# Patient Record
Sex: Female | Born: 1955 | Race: White | Hispanic: No | Smoking: Current every day smoker
Health system: Southern US, Community
[De-identification: ages and names within clinical notes are randomized; demographics above are authoritative.]

## PROBLEM LIST (undated history)

## (undated) DIAGNOSIS — E079 Disorder of thyroid, unspecified: Secondary | ICD-10-CM

## (undated) HISTORY — DX: Disorder of thyroid, unspecified: E07.9

---

## 2005-02-22 ENCOUNTER — Ambulatory Visit: Payer: Self-pay | Admitting: Internal Medicine

## 2005-02-25 ENCOUNTER — Ambulatory Visit: Payer: Self-pay | Admitting: Internal Medicine

## 2005-08-31 ENCOUNTER — Ambulatory Visit: Payer: Self-pay | Admitting: Internal Medicine

## 2006-06-16 ENCOUNTER — Ambulatory Visit: Payer: Self-pay | Admitting: Internal Medicine

## 2006-09-01 ENCOUNTER — Ambulatory Visit: Payer: Self-pay | Admitting: Internal Medicine

## 2007-11-08 ENCOUNTER — Ambulatory Visit: Payer: Self-pay | Admitting: Internal Medicine

## 2008-11-10 ENCOUNTER — Ambulatory Visit: Payer: Self-pay | Admitting: Internal Medicine

## 2009-11-11 ENCOUNTER — Ambulatory Visit: Payer: Self-pay | Admitting: Internal Medicine

## 2010-11-16 ENCOUNTER — Ambulatory Visit: Payer: Self-pay | Admitting: Internal Medicine

## 2011-12-08 ENCOUNTER — Ambulatory Visit: Payer: Self-pay | Admitting: Internal Medicine

## 2012-12-25 ENCOUNTER — Ambulatory Visit: Payer: Self-pay | Admitting: Internal Medicine

## 2014-01-13 ENCOUNTER — Ambulatory Visit: Payer: Self-pay | Admitting: Internal Medicine

## 2014-12-17 ENCOUNTER — Other Ambulatory Visit: Payer: Self-pay | Admitting: Internal Medicine

## 2014-12-17 DIAGNOSIS — Z1231 Encounter for screening mammogram for malignant neoplasm of breast: Secondary | ICD-10-CM

## 2015-01-15 ENCOUNTER — Ambulatory Visit: Payer: Self-pay

## 2015-01-15 ENCOUNTER — Ambulatory Visit
Admission: RE | Admit: 2015-01-15 | Discharge: 2015-01-15 | Disposition: A | Discharge status: Home / Self Care | Payer: No Typology Code available for payment source | Source: Ambulatory Visit | Attending: Internal Medicine | Admitting: Internal Medicine

## 2015-01-15 DIAGNOSIS — Z1231 Encounter for screening mammogram for malignant neoplasm of breast: Secondary | ICD-10-CM | POA: Insufficient documentation

## 2016-07-11 ENCOUNTER — Ambulatory Visit: Payer: No Typology Code available for payment source | Admitting: Oncology

## 2016-07-13 ENCOUNTER — Inpatient Hospital Stay: Payer: BLUE CROSS/BLUE SHIELD | Attending: Hematology and Oncology | Admitting: Hematology and Oncology

## 2016-07-13 ENCOUNTER — Telehealth: Payer: Self-pay | Admitting: *Deleted

## 2016-07-13 ENCOUNTER — Encounter: Payer: Self-pay | Admitting: Hematology and Oncology

## 2016-07-13 VITALS — BP 112/72 | HR 98 | Temp 96.9°F | Resp 18 | Ht 66.5 in | Wt 133.6 lb

## 2016-07-13 DIAGNOSIS — D649 Anemia, unspecified: Secondary | ICD-10-CM | POA: Diagnosis not present

## 2016-07-13 DIAGNOSIS — F17210 Nicotine dependence, cigarettes, uncomplicated: Secondary | ICD-10-CM | POA: Diagnosis not present

## 2016-07-13 DIAGNOSIS — E079 Disorder of thyroid, unspecified: Secondary | ICD-10-CM | POA: Insufficient documentation

## 2016-07-13 DIAGNOSIS — D72820 Lymphocytosis (symptomatic): Secondary | ICD-10-CM | POA: Diagnosis not present

## 2016-07-13 DIAGNOSIS — D472 Monoclonal gammopathy: Secondary | ICD-10-CM | POA: Insufficient documentation

## 2016-07-13 DIAGNOSIS — M5430 Sciatica, unspecified side: Secondary | ICD-10-CM | POA: Insufficient documentation

## 2016-07-13 DIAGNOSIS — R7989 Other specified abnormal findings of blood chemistry: Secondary | ICD-10-CM | POA: Diagnosis not present

## 2016-07-13 DIAGNOSIS — Z79899 Other long term (current) drug therapy: Secondary | ICD-10-CM | POA: Diagnosis not present

## 2016-07-13 DIAGNOSIS — C9110 Chronic lymphocytic leukemia of B-cell type not having achieved remission: Secondary | ICD-10-CM | POA: Insufficient documentation

## 2016-07-13 NOTE — Telephone Encounter (Signed)
Received referral for low dose lung cancer screening CT scan. Voicemail left at phone number listed in EMR for patient to call me back to facilitate scheduling scan.  

## 2016-07-13 NOTE — Progress Notes (Signed)
Patient here today as new evaluation regarding lymphocytosis.  Referred by Dr. Ramonita Lab.  Patient offers no complaints today.

## 2016-07-13 NOTE — Progress Notes (Signed)
[facility] day:  07/13/2016  Chief Complaint: Melissa Castro is a 61 y.o. female with lymphocytosis, monoclonal gammopathy, and abnormal flow cytometry who is referred in consultation by Dr. Ramonita Lab for assessment and management.  HPI:  The patient has been followed by Dr. Caryl Comes for 10-15 years.  She states that blood work is done every 6 months to follow her cholesterol and thyroid function.  She stats that she has had anemia "all my life".  She does not eat much.  She comments that she previously donated blood, but quit 5 years ago.  She describes her diet as a bagel or pop tart in the morning.  She eats peanut butter and bananas or a toasted bagel and peanut butter.  Two to three times a week, she will eat cereal.  Weight is stable.  She denies pica.  She has never had a colonoscopy.  The patient has had lymphocytosis dating back to 12/03/2013.  Absolute lymphocyte count has increased over time from 4410 to 7860.  Peripheral smear on 06/15/2015 revealed 33% lymphocytes and 24% variant lymphocytes.  Flow cytometry for [facility] on 12/10/2014 was negative.  CBC on 06/15/2016 revealed a hematocrit of 41.4, hemoglobin 14.1, MCV 95.4, platelet count 283,000, WBC 12,900.  Differential included 28% neutrophils, 61% lymphocytes, 7% monocytes and 1% basophils.  Absolute lymphocyte count was 7860.  CMP revealed a creatinine 0.8, calcium 9.4, protein 6.3, and albumen 4.3.  SPEP on 06/15/2016 revealed a 0.1 gm/dL monoclonal spike.  IgG was 618 (low).  IgA was 183 and IgM was 31.  Flow cytometry on 06/22/2016 revealed a CD5+, CD23+ monoclonal B cell population with kappa light chain restriction consistent with a CLL/SLL phenotype.  The population represented 97% of the B-cells and 28% of the leukocytes.  There was notation that small populations (< 5,000) of clonal B -cell are classified as monoclonal B-cell lymphocytosis.  CD38 is not expressed on the clonal B-cells.   There was no loss of, or aberrant expression of, the pan T cell antigens to suggest a neoplastic T cell process.  CD4:CD8 was ratio 2.9.  There were no circulating blasts.  There was no immunophenotypicevidence of abnormal myeloid maturation.  There was no monoclonal or abnormal plasma cell population is detected.  Symptomatically, she denies any B symptoms.  She denies any issues with infections.  She denies any adenopathy, bruising or bleeding.  She has had sciatica problems for years.     Past Medical History:  Diagnosis Date  . Thyroid disease     History reviewed. No pertinent surgical history.  History reviewed. No pertinent family history.  Social History:  reports that she has been smoking.  She has a 60.00 pack-year smoking history. She does not have any smokeless tobacco history on file. Her alcohol and drug histories are not on file.  She has smoked 1 1/2 packs/day x 40 years.  She denies any exposure to radiation or toxins.  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  Her husband had a liver transplant.  She lives in [place].  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  The patient is alone today.  Allergies: Not on File  Current Medications: Current Outpatient Prescriptions  Medication Sig Dispense Refill  . Calcium Carbonate-Vitamin D (CALCIUM-VITAMIN D) 600-125 MG-UNIT TABS Take by mouth daily.    . Cholecalciferol (VITAMIN D3) 5000 units TABS Take by mouth.    . Ibuprofen-Diphenhydramine Cit (IBUPROFEN PM PO) Take by mouth at bedtime  as needed.    Marland Kitchen levothyroxine (SYNTHROID, LEVOTHROID) 125 MCG tablet Take by mouth.    . simvastatin (ZOCOR) 20 MG tablet Take by mouth.     No current facility-administered medications for this visit.     Review of Systems:  GENERAL:  Feels fine.  Active.  No fevers, sweats or weight loss. PERFORMANCE STATUS (ECOG): 0 HEENT:  No visual changes, runny nose, sore throat, mouth sores or tenderness. Lungs: No shortness of breath or cough.  No hemoptysis. Cardiac:   No chest pain, palpitations, orthopnea, or PND. Breasts:  No concerns.  No recent mammogram. GI:  No nausea, vomiting, diarrhea, constipation, melena or hematochezia.  No prior colonoscopy. GU:  No urgency, frequency, dysuria, or hematuria. Musculoskeletal:  No back pain.  No joint pain.  No muscle tenderness. Extremities:  No pain or swelling. Skin:  No rashes or skin changes. Neuro:  Sciatica problem for years.  No headache, numbness or weakness, balance or coordination issues. Endocrine:  No diabetes.  Thyroid disease on Synthroid.  No hot flashes or night sweats. Psych:  No mood changes, depression or anxiety. Pain:  No focal pain. Review of systems:  All other systems reviewed and found to be negative.  Physical Exam: Blood pressure 112/72, pulse 98, temperature (!) 96.9 F (36.1 C), temperature source Tympanic, resp. rate 18, weight 133 lb 9.6 oz (60.6 kg). GENERAL:  Well developed, well nourished, woman sitting comfortably in the exam room in no acute distress. MENTAL STATUS:  Alert and oriented to person, place and time. HEAD:  Short dark graying hair.  Normocephalic, atraumatic, face symmetric, no Cushingoid features. EYES:  Brown eyes.  Pupils equal round and reactive to light and accomodation.  No conjunctivitis or scleral icterus. ENT:  Oropharynx clear without lesion.  Tongue normal. Mucous membranes moist.  RESPIRATORY:  Clear to auscultation without rales, wheezes or rhonchi. CARDIOVASCULAR:  Regular rate and rhythm without murmur, rub or gallop. ABDOMEN:  Soft, non-tender, with active bowel sounds, and no hepatosplenomegaly.  No masses. SKIN:  No rashes, ulcers or lesions. EXTREMITIES: No edema, no skin discoloration or tenderness.  No palpable cords. LYMPH NODES: No palpable cervical, supraclavicular, axillary or inguinal adenopathy  NEUROLOGICAL: Unremarkable. PSYCH:  Appropriate.   No visits with results within 3 Day(s) from this visit.  Latest known visit with  results is:  No results found for any previous visit.    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  She has had lymphocytosis dating back to 12/03/2013.  Absolute lymphocyte count has increased over time from 4410 to 7860.  Peripheral smear on 06/15/2015 revealed 33% lymphocytes and 24% variant lymphocytes.    CBC on 06/15/2016 revealed a hematocrit of 41.4, hemoglobin 14.1, MCV 95.4, platelet count 283,000, WBC 12,900.  Differential included 28% neutrophils, 61% lymphocytes, 7% monocytes and 1% basophils.  Absolute lymphocyte count was 7860.  CMP revealed a creatinine 0.8, calcium 9.4, protein 6.3, and albumen 4.3.  SPEP on 06/15/2016 revealed a 0.1 gm/dL monoclonal spike.  IgG was 618 (low).  IgA was 183 and IgM was 31.  Flow cytometry on 06/22/2016 revealed a CD5+, CD23+ monoclonal B cell population with kappa light chain restriction consistent with a CLL/SLL phenotype.  The population represented 97% of the B-cells and 28% of the leukocytes (3612).  There was notation that small populations (< 5,000) of clonal B -cell are classified as monoclonal B-cell lymphocytosis.  CD38 was  not expressed on the clonal B-cells.  There was no loss of, or aberrant expression of, the pan T cell antigens to suggest a neoplastic T cell process.  CD4:CD8 was ratio 2.9.  There were no circulating blasts.  There was no immunophenotypicevidence of abnormal myeloid maturation.  There was no monoclonal or abnormal plasma cell population is detected.  Symptomatically, she denies any B symptoms.  She denies any issues with infections.  She denies any adenopathy, bruising or bleeding.  She has had sciatica problems for years.    Plan: 1.  Discuss recent work-up.  Based on recent flow cytometry suspect she has monoclonal B cell lymphocytosis.  It is also possible that she has stage 0 CLL.  Risk of transformation to CLL is 1%/year.  CBC is otherwise normal.   No B symptoms.  No palpable adenopathy or hepatosplenomegaly.  No issues with infections.  Discuss plan for monitoring every 6 months. 2.  Discuss need for mammogram (late). 3.  Discuss colonoscopy (no prior per patient). 4.  Discuss smoking cessation. 5.  Discuss referral to [facility] for low dose chest CT program. 6.  RTC in 5 months for MD assessment and labs (CBC with diff, CMP, LDH, SPEP, immunofixation, free light chains, beta 2-microglobulin).   Lequita Asal, MD  07/13/2016, 10:44 AM

## 2016-07-19 ENCOUNTER — Other Ambulatory Visit: Payer: Self-pay | Admitting: Internal Medicine

## 2016-07-19 DIAGNOSIS — Z1231 Encounter for screening mammogram for malignant neoplasm of breast: Secondary | ICD-10-CM

## 2016-08-11 ENCOUNTER — Telehealth: Payer: Self-pay | Admitting: *Deleted

## 2016-08-11 NOTE — Telephone Encounter (Signed)
Received referral for low dose lung cancer screening CT scan. Voicemail left at phone number listed in EMR for patient to call me back to facilitate scheduling scan.  

## 2016-08-16 ENCOUNTER — Ambulatory Visit
Admission: RE | Admit: 2016-08-16 | Discharge: 2016-08-16 | Disposition: A | Discharge status: Home / Self Care | Payer: BLUE CROSS/BLUE SHIELD | Source: Ambulatory Visit | Attending: Internal Medicine | Admitting: Internal Medicine

## 2016-08-16 DIAGNOSIS — Z1231 Encounter for screening mammogram for malignant neoplasm of breast: Secondary | ICD-10-CM | POA: Insufficient documentation

## 2016-09-04 ENCOUNTER — Encounter: Payer: Self-pay | Admitting: Hematology and Oncology

## 2016-09-04 DIAGNOSIS — D472 Monoclonal gammopathy: Secondary | ICD-10-CM | POA: Insufficient documentation

## 2016-09-05 ENCOUNTER — Encounter: Payer: Self-pay | Admitting: *Deleted

## 2016-12-12 ENCOUNTER — Ambulatory Visit: Payer: BLUE CROSS/BLUE SHIELD | Admitting: Hematology and Oncology

## 2016-12-12 ENCOUNTER — Other Ambulatory Visit: Payer: BLUE CROSS/BLUE SHIELD

## 2017-01-16 ENCOUNTER — Other Ambulatory Visit: Payer: BLUE CROSS/BLUE SHIELD

## 2017-01-16 ENCOUNTER — Ambulatory Visit: Payer: BLUE CROSS/BLUE SHIELD | Admitting: Hematology and Oncology

## 2017-01-19 ENCOUNTER — Inpatient Hospital Stay: Payer: BLUE CROSS/BLUE SHIELD | Attending: Hematology and Oncology | Admitting: Hematology and Oncology

## 2017-01-19 ENCOUNTER — Encounter: Payer: Self-pay | Admitting: Hematology and Oncology

## 2017-01-19 ENCOUNTER — Inpatient Hospital Stay: Payer: BLUE CROSS/BLUE SHIELD

## 2017-01-19 ENCOUNTER — Telehealth: Payer: Self-pay | Admitting: *Deleted

## 2017-01-19 VITALS — BP 122/71 | HR 76 | Temp 96.5°F | Resp 18 | Wt 137.2 lb

## 2017-01-19 DIAGNOSIS — R079 Chest pain, unspecified: Secondary | ICD-10-CM | POA: Insufficient documentation

## 2017-01-19 DIAGNOSIS — D72820 Lymphocytosis (symptomatic): Secondary | ICD-10-CM | POA: Diagnosis not present

## 2017-01-19 DIAGNOSIS — Z87891 Personal history of nicotine dependence: Secondary | ICD-10-CM

## 2017-01-19 DIAGNOSIS — Z79899 Other long term (current) drug therapy: Secondary | ICD-10-CM | POA: Diagnosis not present

## 2017-01-19 DIAGNOSIS — F17210 Nicotine dependence, cigarettes, uncomplicated: Secondary | ICD-10-CM | POA: Insufficient documentation

## 2017-01-19 DIAGNOSIS — D472 Monoclonal gammopathy: Secondary | ICD-10-CM

## 2017-01-19 DIAGNOSIS — K047 Periapical abscess without sinus: Secondary | ICD-10-CM | POA: Insufficient documentation

## 2017-01-19 LAB — CBC WITH DIFFERENTIAL/PLATELET
Basophils Absolute: 0.1 10*3/uL (ref 0–0.1)
Basophils Relative: 1 %
Eosinophils Absolute: 0.6 10*3/uL (ref 0–0.7)
Eosinophils Relative: 6 %
HCT: 39 % (ref 35.0–47.0)
Hemoglobin: 13.5 g/dL (ref 12.0–16.0)
Lymphocytes Relative: 52 %
Lymphs Abs: 5.8 10*3/uL — ABNORMAL HIGH (ref 1.0–3.6)
MCH: 32.2 pg (ref 26.0–34.0)
MCHC: 34.6 g/dL (ref 32.0–36.0)
MCV: 93 fL (ref 80.0–100.0)
Monocytes Absolute: 0.6 10*3/uL (ref 0.2–0.9)
Monocytes Relative: 6 %
Neutro Abs: 3.9 10*3/uL (ref 1.4–6.5)
Neutrophils Relative %: 35 %
Platelets: 265 10*3/uL (ref 150–440)
RBC: 4.2 MIL/uL (ref 3.80–5.20)
RDW: 13.7 % (ref 11.5–14.5)
WBC: 11.1 10*3/uL — ABNORMAL HIGH (ref 3.6–11.0)

## 2017-01-19 LAB — LACTATE DEHYDROGENASE: LDH: 161 U/L (ref 98–192)

## 2017-01-19 LAB — COMPREHENSIVE METABOLIC PANEL
ALT: 18 U/L (ref 14–54)
AST: 27 U/L (ref 15–41)
Albumin: 4.5 g/dL (ref 3.5–5.0)
Alkaline Phosphatase: 51 U/L (ref 38–126)
Anion gap: 6 (ref 5–15)
BUN: 11 mg/dL (ref 6–20)
CO2: 28 mmol/L (ref 22–32)
Calcium: 9.5 mg/dL (ref 8.9–10.3)
Chloride: 103 mmol/L (ref 101–111)
Creatinine, Ser: 0.81 mg/dL (ref 0.44–1.00)
GFR calc Af Amer: >60 mL/min (ref >60)
GFR calc non Af Amer: >60 mL/min (ref >60)
Glucose, Bld: 130 mg/dL — ABNORMAL HIGH (ref 65–99)
Potassium: 4.3 mmol/L (ref 3.5–5.1)
Sodium: 137 mmol/L (ref 135–145)
Total Bilirubin: 0.5 mg/dL (ref 0.3–1.2)
Total Protein: 7 g/dL (ref 6.5–8.1)

## 2017-01-19 NOTE — Progress Notes (Signed)
[facility] day:  01/19/2017  Chief Complaint: Melissa Castro is a 61 y.o. 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  HPI:  The patient was last seen in the medical oncology clinic on 07/13/2016.  At that time, she was seen for initial consultation.  She had a small populations (< 5,000) of clonal B -cells.  She denied any B symptoms.  She denied any issues with infections.  She denied any adenopathy, bruising or bleeding. She was felt to have a monoclonal-B cell lymphocytosis or stage 0 CLL.  We discussed plan for surveillance every 6 months.  At last visit, we discussed mammogram (late), colonoscopy (no prior per patient), smoking cessation, and referral to the low dose chest CT program.  Bilateral mammogram on 08/16/2016 revealed no evidence of malignancy.  Symptomatically, she states "I'm fine". She denies any B symptoms. She has had a dental infection secondary to receding gums. She declines referral to gastroenterology. She is smoking less or equal to 1 pack a day. She has been playing phone tag with [facility] Est[facility], RN regarding low dose chest CT scan program.   Past Medical History:  Diagnosis Date  . Thyroid disease     History reviewed. No pertinent surgical history.  Family History  Problem Relation Age of Onset  . Breast cancer Neg Hx     Social History:  reports that she has been smoking.  She has a 60.00 pack-year smoking history. She has never used smokeless tobacco. Her alcohol and drug histories are not on file.  She has smoked 1 1/2 packs/day x 40 years.  She denies any exposure to radiation or toxins.  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  Her husband had a liver transplant.  She lives in [place].  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  The patient is alone today.  Allergies: Not on File  Current Medications: Current Outpatient Prescriptions  Medication Sig Dispense Refill  . Calcium Carbonate-Vitamin  D (CALCIUM-VITAMIN D) 600-125 MG-UNIT TABS Take by mouth daily.    . Cholecalciferol (VITAMIN D3) 5000 units TABS Take by mouth.    . Ibuprofen-Diphenhydramine Cit (IBUPROFEN PM PO) Take by mouth at bedtime as needed.    Marland Kitchen levothyroxine (SYNTHROID, LEVOTHROID) 125 MCG tablet Take by mouth.    . simvastatin (ZOCOR) 20 MG tablet Take by mouth.    Marland Kitchen amoxicillin (AMOXIL) 875 MG tablet Take 875 mg by mouth 3 (three) times daily.  0   No current facility-administered medications for this visit.     Review of Systems:  GENERAL:  Feels fine.  Active.  No fevers, sweats or weight loss.  Weight up 4 pounds. PERFORMANCE STATUS (ECOG): 0 HEENT:  No visual changes, runny nose, sore throat, mouth sores or tenderness. Lungs: No shortness of breath or cough.  No hemoptysis. Cardiac:  No chest pain, palpitations, orthopnea, or PND. Breasts:  No concerns.  Recent mammogram (see HPI). GI:  No nausea, vomiting, diarrhea, constipation, melena or hematochezia.  No prior colonoscopy. GU:  No urgency, frequency, dysuria, or hematuria. Musculoskeletal:  No back pain.  No joint pain.  No muscle tenderness. Extremities:  No pain or swelling. Skin:  No rashes or skin changes. Neuro:  Sciatica problem for years.  No headache, numbness or weakness, balance or coordination issues. Endocrine:  No diabetes.  Thyroid disease on Synthroid.  No hot flashes or night sweats. Psych:  No mood changes, depression or anxiety. Pain:  No focal pain. Review of systems:  All other systems reviewed and found to be negative.  Physical Exam: Blood pressure 122/71, pulse 76, temperature (!) 96.5 F (35.8 C), temperature source Tympanic, resp. rate 18, weight 137 lb 4 oz (62.3 kg). GENERAL:  Well developed, well nourished, woman sitting comfortably in the exam room in no acute distress. MENTAL STATUS:  Alert and oriented to person, place and time. HEAD:  Short dark graying hair.  Normocephalic, atraumatic, face symmetric, no  Cushingoid features. EYES:  Brown eyes.  Pupils equal round and reactive to light and accomodation.  No conjunctivitis or scleral icterus. ENT:  Oropharynx clear without lesion.  Tongue normal. Mucous membranes moist.  RESPIRATORY:  Clear to auscultation without rales, wheezes or rhonchi. CARDIOVASCULAR:  Regular rate and rhythm without murmur, rub or gallop. ABDOMEN:  Soft, non-tender, with active bowel sounds, and no hepatosplenomegaly.  No masses. SKIN:  No rashes, ulcers or lesions. EXTREMITIES: No edema, no skin discoloration or tenderness.  No palpable cords. LYMPH NODES: No palpable cervical, supraclavicular, axillary or inguinal adenopathy  NEUROLOGICAL: Unremarkable. PSYCH:  Appropriate.   Appointment on 01/19/2017  Component Date Value Ref Range Status  . WBC 01/19/2017 11.1* 3.6 - 11.0 K/uL Final  . RBC 01/19/2017 4.20  3.80 - 5.20 MIL/uL Final  . Hemoglobin 01/19/2017 13.5  12.0 - 16.0 g/dL Final  . HCT 01/19/2017 39.0  35.0 - 47.0 % Final  . MCV 01/19/2017 93.0  80.0 - 100.0 fL Final  . MCH 01/19/2017 32.2  26.0 - 34.0 pg Final  . MCHC 01/19/2017 34.6  32.0 - 36.0 g/dL Final  . RDW 01/19/2017 13.7  11.5 - 14.5 % Final  . Platelets 01/19/2017 265  150 - 440 K/uL Final  . Neutrophils Relative % 01/19/2017 35  % Final  . Neutro Abs 01/19/2017 3.9  1.4 - 6.5 K/uL Final  . Lymphocytes Relative 01/19/2017 52  % Final  . Lymphs Abs 01/19/2017 5.8* 1.0 - 3.6 K/uL Final  . Monocytes Relative 01/19/2017 6  % Final  . Monocytes Absolute 01/19/2017 0.6  0.2 - 0.9 K/uL Final  . Eosinophils Relative 01/19/2017 6  % Final  . Eosinophils Absolute 01/19/2017 0.6  0 - 0.7 K/uL Final  . Basophils Relative 01/19/2017 1  % Final  . Basophils Absolute 01/19/2017 0.1  0 - 0.1 K/uL Final  . LDH 01/19/2017 161  98 - 192 U/L Final  . Sodium 01/19/2017 137  135 - 145 mmol/L Final  . Potassium 01/19/2017 4.3  3.5 - 5.1 mmol/L Final  . Chloride 01/19/2017 103  101 - 111 mmol/L Final  . CO2  01/19/2017 28  22 - 32 mmol/L Final  . Glucose, Bld 01/19/2017 130* 65 - 99 mg/dL Final  . BUN 01/19/2017 11  6 - 20 mg/dL Final  . Creatinine, Ser 01/19/2017 0.81  0.44 - 1.00 mg/dL Final  . Calcium 01/19/2017 9.5  8.9 - 10.3 mg/dL Final  . Total Protein 01/19/2017 7.0  6.5 - 8.1 g/dL Final  . Albumin 01/19/2017 4.5  3.5 - 5.0 g/dL Final  . AST 01/19/2017 27  15 - 41 U/L Final  . ALT 01/19/2017 18  14 - 54 U/L Final  . Alkaline Phosphatase 01/19/2017 51  38 - 126 U/L Final  . Total Bilirubin 01/19/2017 0.5  0.3 - 1.2 mg/dL Final  . GFR calc non Af Amer 01/19/2017 >60  >60 mL/min Final  . GFR calc Af Amer 01/19/2017 >60  >60 mL/min Final  Comment: (NOTE) The eGFR has been calculated using the CKD EPI equation. This calculation has not been validated in all clinical situations. eGFR's persistently <60 mL/min signify possible Chronic Kidney Disease.   Melissa Castro gap 01/19/2017 6  5 - 15 Final    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  She has had lymphocytosis dating back to 12/03/2013.  Absolute lymphocyte count has increased over time from 4410 to 7860.  Peripheral smear on 06/15/2015 revealed 33% lymphocytes and 24% variant lymphocytes.    CBC on 06/15/2016 revealed a hematocrit of 41.4, hemoglobin 14.1, MCV 95.4, platelet count 283,000, WBC 12,900.  Differential included 28% neutrophils, 61% lymphocytes, 7% monocytes and 1% basophils.  Absolute lymphocyte count was 7860.  CMP revealed a creatinine 0.8, calcium 9.4, protein 6.3, and albumen 4.3.  SPEP on 06/15/2016 revealed a 0.1 gm/dL monoclonal spike.  IgG was 618 (low).  IgA was 183 and IgM was 31.  Flow cytometry on 06/22/2016 revealed a CD5+, CD23+ monoclonal B cell population with kappa light chain restriction consistent with a CLL/SLL phenotype.  The population represented 97% of the B-cells and 28% of the leukocytes (3612).  There was notation that small  populations (< 5,000) of clonal B -cell are classified as monoclonal B-cell lymphocytosis.  CD38 was not expressed on the clonal B-cells.  There was no loss of, or aberrant expression of, the pan T cell antigens to suggest a neoplastic T cell process.  CD4:CD8 was ratio 2.9.  There were no circulating blasts.  There was no immunophenotypicevidence of abnormal myeloid maturation.  There was no monoclonal or abnormal plasma cell population is detected.  Bilateral mammogram on 08/16/2016 revealed no evidence of malignancy.  Symptomatically, she denies any B symptoms.  She has a dental infection.  She denies any adenopathy, bruising or bleeding.  Exam is normal.  Plan: 1.  Labs today:  CBC with diff, CMP, LDH, SPEP, immunofixation, free light chains, beta 2-microglobulin. 2.  Discuss interval mammogram.  No evidence of malignancy. 3.  Encourage follow-up colonoscopy.  Patient wishes to defer. 4.  Discuss enrollment in the low-dose chest CT program. Melissa Crutch, RN will be contacted today as they have been playing phone tag. 5.  Encourage smoking cessation. 6.  Discuss stability in counts.  Continue observation of monoclonal-B cell lymphocytosis or early stage 0 CLL. Risk of transformation to CLL is 1%/year.  No B symptoms.  No palpable adenopathy or hepatosplenomegaly.  No issues with infections except for recent dental infection.  Discuss plan for ongoing monitoring every 6 months. 7.  Patient to be seen today by [facility] for low dose chest CT program. 8.  RTC in 6 months for MD assessment and labs (CBC with diff, CMP, LDH).   Lequita Asal, MD  01/19/2017, 2:46 PM

## 2017-01-19 NOTE — Telephone Encounter (Signed)
Received referral for initial lung cancer screening scan. Contacted patient and obtained smoking history,(current, 64.5 pack year) as well as answering questions related to screening process. Patient denies signs of lung cancer such as weight loss or hemoptysis. Patient denies comorbidity that would prevent curative treatment if lung cancer were found. Patient is scheduled for shared decision making visit and CT scan on 02/07/17.

## 2017-01-19 NOTE — Progress Notes (Signed)
Patient is currently on antibiotics for am infected tooth.  Patient offers no other complaints today.

## 2017-01-20 LAB — KAPPA/LAMBDA LIGHT CHAINS
Kappa free light chain: 16.2 mg/L (ref 3.3–19.4)
Kappa, lambda light chain ratio: 1.05 (ref 0.26–1.65)
Lambda free light chains: 15.5 mg/L (ref 5.7–26.3)

## 2017-01-20 LAB — BETA 2 MICROGLOBULIN, SERUM: Beta-2 Microglobulin: 1.6 mg/L (ref 0.6–2.4)

## 2017-01-23 LAB — MULTIPLE MYELOMA PANEL, SERUM
Albumin SerPl Elph-Mcnc: 4 g/dL (ref 2.9–4.4)
Albumin/Glob SerPl: 1.7 (ref 0.7–1.7)
Alpha 1: 0.2 g/dL (ref 0.0–0.4)
Alpha2 Glob SerPl Elph-Mcnc: 0.6 g/dL (ref 0.4–1.0)
B-Globulin SerPl Elph-Mcnc: 1 g/dL (ref 0.7–1.3)
Gamma Glob SerPl Elph-Mcnc: 0.6 g/dL (ref 0.4–1.8)
Globulin, Total: 2.4 g/dL (ref 2.2–3.9)
IgA: 194 mg/dL (ref 87–352)
IgG (Immunoglobin G), Serum: 550 mg/dL — ABNORMAL LOW (ref 700–1600)
IgM, Serum: 29 mg/dL (ref 26–217)
M Protein SerPl Elph-Mcnc: 0.1 g/dL — ABNORMAL HIGH
Total Protein ELP: 6.4 g/dL (ref 6.0–8.5)

## 2017-02-07 ENCOUNTER — Encounter: Payer: Self-pay | Admitting: Oncology

## 2017-02-07 ENCOUNTER — Ambulatory Visit
Admission: RE | Admit: 2017-02-07 | Discharge: 2017-02-07 | Disposition: A | Discharge status: Home / Self Care | Payer: BLUE CROSS/BLUE SHIELD | Source: Ambulatory Visit | Attending: Oncology | Admitting: Oncology

## 2017-02-07 ENCOUNTER — Inpatient Hospital Stay: Payer: BLUE CROSS/BLUE SHIELD | Attending: Oncology | Admitting: Oncology

## 2017-02-07 DIAGNOSIS — Z122 Encounter for screening for malignant neoplasm of respiratory organs: Secondary | ICD-10-CM

## 2017-02-07 DIAGNOSIS — Z87891 Personal history of nicotine dependence: Secondary | ICD-10-CM

## 2017-02-07 DIAGNOSIS — F17210 Nicotine dependence, cigarettes, uncomplicated: Secondary | ICD-10-CM

## 2017-02-07 NOTE — Progress Notes (Signed)
In accordance with CMS guidelines, patient has met eligibility criteria including age, absence of signs or symptoms of lung cancer.  Social History  Substance Use Topics  . Smoking status: Current Every Day Smoker    Packs/day: 1.50    Years: 43.00  . Smokeless tobacco: Never Used  . Alcohol use Not on file     A shared decision-making session was conducted prior to the performance of CT scan. This includes one or more decision aids, includes benefits and harms of screening, follow-up diagnostic testing, over-diagnosis, false positive rate, and total radiation exposure.  Counseling on the importance of adherence to annual lung cancer LDCT screening, impact of co-morbidities, and ability or willingness to undergo diagnosis and treatment is imperative for compliance of the program.  Counseling on the importance of continued smoking cessation for former smokers; the importance of smoking cessation for current smokers, and information about tobacco cessation interventions have been given to patient including [place] and [facility] quit [place] programs.  Written order for lung cancer screening with LDCT has been given to the patient and any and all questions have been answered to the best of my abilities.   Yearly follow up will be coordinated by [facility], Thoracic Navigator.  Faythe Casa, NP 02/07/2017 2:10 PM

## 2017-02-10 ENCOUNTER — Encounter: Payer: Self-pay | Admitting: *Deleted

## 2017-07-24 ENCOUNTER — Inpatient Hospital Stay: Payer: BLUE CROSS/BLUE SHIELD | Attending: Hematology and Oncology

## 2017-07-24 ENCOUNTER — Inpatient Hospital Stay (HOSPITAL_BASED_OUTPATIENT_CLINIC_OR_DEPARTMENT_OTHER): Payer: BLUE CROSS/BLUE SHIELD | Admitting: Hematology and Oncology

## 2017-07-24 VITALS — BP 129/83 | HR 80 | Temp 97.1°F | Resp 20 | Wt 138.2 lb

## 2017-07-24 DIAGNOSIS — D72820 Lymphocytosis (symptomatic): Secondary | ICD-10-CM | POA: Diagnosis not present

## 2017-07-24 DIAGNOSIS — Z720 Tobacco use: Secondary | ICD-10-CM | POA: Diagnosis not present

## 2017-07-24 DIAGNOSIS — D472 Monoclonal gammopathy: Secondary | ICD-10-CM

## 2017-07-24 LAB — COMPREHENSIVE METABOLIC PANEL
ALT: 21 U/L (ref 14–54)
AST: 28 U/L (ref 15–41)
Albumin: 4.3 g/dL (ref 3.5–5.0)
Alkaline Phosphatase: 54 U/L (ref 38–126)
Anion gap: 9 (ref 5–15)
BUN: 14 mg/dL (ref 6–20)
CO2: 24 mmol/L (ref 22–32)
Calcium: 9 mg/dL (ref 8.9–10.3)
Chloride: 105 mmol/L (ref 101–111)
Creatinine, Ser: 0.67 mg/dL (ref 0.44–1.00)
GFR calc Af Amer: >60 mL/min (ref >60)
GFR calc non Af Amer: >60 mL/min (ref >60)
Glucose, Bld: 118 mg/dL — ABNORMAL HIGH (ref 65–99)
Potassium: 4 mmol/L (ref 3.5–5.1)
Sodium: 138 mmol/L (ref 135–145)
Total Bilirubin: 0.4 mg/dL (ref 0.3–1.2)
Total Protein: 6.6 g/dL (ref 6.5–8.1)

## 2017-07-24 LAB — CBC WITH DIFFERENTIAL/PLATELET
Basophils Absolute: 0.1 10*3/uL (ref 0–0.1)
Basophils Relative: 1 %
Eosinophils Absolute: 0.3 10*3/uL (ref 0–0.7)
Eosinophils Relative: 3 %
HCT: 39.3 % (ref 35.0–47.0)
Hemoglobin: 13.3 g/dL (ref 12.0–16.0)
Lymphocytes Relative: 59 %
Lymphs Abs: 6.5 10*3/uL — ABNORMAL HIGH (ref 1.0–3.6)
MCH: 32.1 pg (ref 26.0–34.0)
MCHC: 33.9 g/dL (ref 32.0–36.0)
MCV: 94.7 fL (ref 80.0–100.0)
Monocytes Absolute: 0.6 10*3/uL (ref 0.2–0.9)
Monocytes Relative: 6 %
Neutro Abs: 3.3 10*3/uL (ref 1.4–6.5)
Neutrophils Relative %: 31 %
Platelets: 240 10*3/uL (ref 150–440)
RBC: 4.15 MIL/uL (ref 3.80–5.20)
RDW: 13.8 % (ref 11.5–14.5)
WBC: 10.8 10*3/uL (ref 3.6–11.0)

## 2017-07-24 LAB — LACTATE DEHYDROGENASE: LDH: 142 U/L (ref 98–192)

## 2017-07-24 NOTE — Progress Notes (Signed)
Patient offers no complaints today. 

## 2017-07-24 NOTE — Progress Notes (Signed)
[facility] day:  07/24/2017  Chief Complaint: Melissa Castro is a 62 y.o. 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  HPI:  The patient was last seen in the medical oncology clinic on 01/19/2017.  At that time, she denied any B symptoms.  She had a dental infection.  She denied any adenopathy, bruising or bleeding.  Exam was normal.  We discussed ongoing surveillance.  At last visit, we discussed the need for colonoscopy.  She declined  We discussed the low dose chest CT program.  Low dose chest CT on 02/07/2017 revealed Lung-RADS 2, benign appearance or behavior. There was a tiny solid pulmonary nodule in the right middle lobe measuring 2.7 mm.  Continued annual screening with low-dose chest CT without contrast was recommended in 12 months.  During the interim, patient is doing well. Previously reported dental infection cleared without incident. Patient denies any other recurrent infections. Patient denies bleeding; no hematochezia, melena, or vaginal bleeding. She has not appreciated any adenopathy.  Patient is eating well. Her weight is stable. Patient denies pain in the clinic today.    Past Medical History:  Diagnosis Date  . Thyroid disease     No past surgical history on file.  Family History  Problem Relation Age of Onset  . Breast cancer Neg Hx     Social History:  reports that she has been smoking.  She has a 64.50 pack-year smoking history. she has never used smokeless tobacco. Her alcohol and drug histories are not on file.  She has smoked 1 1/2 packs/day x 40 years.  She denies any exposure to radiation or toxins.  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  Her husband had a liver transplant.  She lives in [place].  She likes to be called Melissa Castro.  The patient is alone today.  Allergies: Not on File  Current Medications: Current Outpatient Medications  Medication Sig Dispense Refill  . Calcium  Carbonate-Vitamin D (CALCIUM-VITAMIN D) 600-125 MG-UNIT TABS Take by mouth daily.    . Cholecalciferol (VITAMIN D3) 5000 units TABS Take by mouth.    . Ibuprofen-Diphenhydramine Cit (IBUPROFEN PM PO) Take by mouth at bedtime as needed.    Marland Kitchen levothyroxine (SYNTHROID, LEVOTHROID) 125 MCG tablet Take by mouth.    . simvastatin (ZOCOR) 20 MG tablet Take by mouth.     No current facility-administered medications for this visit.     Review of Systems:  GENERAL:  Feels fine.  No problems.  No fevers, sweats or weight loss.  Weight up 1 pound. PERFORMANCE STATUS (ECOG): 0 HEENT:  No visual changes, runny nose, sore throat, mouth sores or tenderness. Lungs: No shortness of breath or cough.  No hemoptysis. Cardiac:  No chest pain, palpitations, orthopnea, or PND. Breasts:  No concerns.  Recent mammogram (see HPI). GI:  No nausea, vomiting, diarrhea, constipation, melena or hematochezia.  No prior colonoscopy. GU:  No urgency, frequency, dysuria, or hematuria. Musculoskeletal:  No back pain.  No joint pain.  No muscle tenderness. Extremities:  No pain or swelling. Skin:  No rashes or skin changes. Neuro:  Sciatica for years.  No headache, numbness or weakness, balance or coordination issues. Endocrine:  No diabetes.  Thyroid disease on Synthroid.  No hot flashes or night sweats. Psych:  No mood changes, depression or anxiety. Pain:  No focal pain. Review of systems:  All other systems reviewed and found to be negative.  Physical Exam:  Blood pressure 129/83, pulse 80, temperature (!) 97.1 F (36.2 C), temperature source Tympanic, resp. rate 20, weight 138 lb 3 oz (62.7 kg). GENERAL:  Well developed, well nourished, woman sitting comfortably in the exam room in no acute distress. MENTAL STATUS:  Alert and oriented to person, place and time. HEAD:  Short black hair.  Normocephalic, atraumatic, face symmetric, no Cushingoid features. EYES:  Brown eyes.  Pupils equal round and reactive to light and  accomodation.  No conjunctivitis or scleral icterus. ENT:  Oropharynx clear without lesion.  Tongue normal. Mucous membranes moist.  RESPIRATORY:  Clear to auscultation without rales, wheezes or rhonchi. CARDIOVASCULAR:  Regular rate and rhythm without murmur, rub or gallop. ABDOMEN:  Soft, non-tender, with active bowel sounds, and no hepatosplenomegaly.  No masses. SKIN:  No rashes, ulcers or lesions. EXTREMITIES: No edema, no skin discoloration or tenderness.  No palpable cords. LYMPH NODES: No palpable cervical, supraclavicular, axillary or inguinal adenopathy  NEUROLOGICAL: Unremarkable. PSYCH:  Appropriate.   Appointment on 07/24/2017  Component Date Value Ref Range Status  . LDH 07/24/2017 142  98 - 192 U/L Final   Performed at [facility], [address] [place], [place] [address]  . WBC 07/24/2017 10.8  3.6 - 11.0 K/uL Final  . RBC 07/24/2017 4.15  3.80 - 5.20 MIL/uL Final  . Hemoglobin 07/24/2017 13.3  12.0 - 16.0 g/dL Final  . HCT 07/24/2017 39.3  35.0 - 47.0 % Final  . MCV 07/24/2017 94.7  80.0 - 100.0 fL Final  . MCH 07/24/2017 32.1  26.0 - 34.0 pg Final  . MCHC 07/24/2017 33.9  32.0 - 36.0 g/dL Final  . RDW 07/24/2017 13.8  11.5 - 14.5 % Final  . Platelets 07/24/2017 240  150 - 440 K/uL Final  . Neutrophils Relative % 07/24/2017 31  % Final  . Neutro Abs 07/24/2017 3.3  1.4 - 6.5 K/uL Final  . Lymphocytes Relative 07/24/2017 59  % Final  . Lymphs Abs 07/24/2017 6.5* 1.0 - 3.6 K/uL Final  . Monocytes Relative 07/24/2017 6  % Final  . Monocytes Absolute 07/24/2017 0.6  0.2 - 0.9 K/uL Final  . Eosinophils Relative 07/24/2017 3  % Final  . Eosinophils Absolute 07/24/2017 0.3  0 - 0.7 K/uL Final  . Basophils Relative 07/24/2017 1  % Final  . Basophils Absolute 07/24/2017 0.1  0 - 0.1 K/uL Final   Performed at [facility], [address] [place], [place] [address]  . Sodium 07/24/2017 138  135 - 145 mmol/L Final  . Potassium 07/24/2017 4.0  3.5 - 5.1  mmol/L Final  . Chloride 07/24/2017 105  101 - 111 mmol/L Final  . CO2 07/24/2017 24  22 - 32 mmol/L Final  . Glucose, Bld 07/24/2017 118* 65 - 99 mg/dL Final  . BUN 07/24/2017 14  6 - 20 mg/dL Final  . Creatinine, Ser 07/24/2017 0.67  0.44 - 1.00 mg/dL Final  . Calcium 07/24/2017 9.0  8.9 - 10.3 mg/dL Final  . Total Protein 07/24/2017 6.6  6.5 - 8.1 g/dL Final  . Albumin 07/24/2017 4.3  3.5 - 5.0 g/dL Final  . AST 07/24/2017 28  15 - 41 U/L Final  . ALT 07/24/2017 21  14 - 54 U/L Final  . Alkaline Phosphatase 07/24/2017 54  38 - 126 U/L Final  . Total Bilirubin 07/24/2017 0.4  0.3 - 1.2 mg/dL Final  . GFR calc non Af Amer 07/24/2017 >60  >60 mL/min Final  . GFR calc  Af Amer 07/24/2017 >60  >60 mL/min Final   Comment: (NOTE) The eGFR has been calculated using the CKD EPI equation. This calculation has not been validated in all clinical situations. eGFR's persistently <60 mL/min signify possible Chronic Kidney Disease.   Georgiann Hahn gap 07/24/2017 9  5 - 15 Final   Performed at [facility], [address] [place], [place] [address]    Assessment:  Melissa Castro is a 62 y.o. 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  She has had lymphocytosis dating back to 12/03/2013.  Absolute lymphocyte count has increased over time from 4410 to 7860.  Peripheral smear on 06/15/2015 revealed 33% lymphocytes and 24% variant lymphocytes.    CBC on 06/15/2016 revealed a hematocrit of 41.4, hemoglobin 14.1, MCV 95.4, platelet count 283,000, WBC 12,900.  Differential included 28% neutrophils, 61% lymphocytes, 7% monocytes and 1% basophils.  Absolute lymphocyte count was 7860.  CMP revealed a creatinine 0.8, calcium 9.4, protein 6.3, and albumen 4.3.  SPEP on 06/15/2016 revealed a 0.1 gm/dL monoclonal spike.  IgG was 618 (low).  IgA was 183 and IgM was 31.  Flow cytometry on 06/22/2016 revealed a CD5+, CD23+ monoclonal B cell population with kappa light chain restriction  consistent with a CLL/SLL phenotype.  The population represented 97% of the B-cells and 28% of the leukocytes (3612).  There was notation that small populations (< 5,000) of clonal B -cell are classified as monoclonal B-cell lymphocytosis.  CD38 was not expressed on the clonal B-cells.  There was no loss of, or aberrant expression of, the pan T cell antigens to suggest a neoplastic T cell process.  CD4:CD8 was ratio 2.9.  There were no circulating blasts.  There was no immunophenotypicevidence of abnormal myeloid maturation.  There was no monoclonal or abnormal plasma cell population is detected.  Low dose chest CT on 02/07/2017 revealed Lung-RADS 2, benign appearance or behavior. There was a tiny solid pulmonary nodule in the right middle lobe measuring 2.7 mm.  Continued annual screening was recommended in 12 months.  Bilateral mammogram on 08/16/2016 revealed no evidence of malignancy.  Symptomatically, she denies any fevers, sweats or weight loss.  She denies any bruising, bleeding or interval infections.  Exam is normal.  Labs were unremarkable.   Plan: 1.  Labs today:  CBC with diff, CMP, LDH. 2.  Discuss interval low dose chest CT- no evidence of malignancy.  Continue yearly surveillance. 3.  Encourage smoking cessation. 4.  Discuss stability in counts.  Continue observation of monoclonal-B cell lymphocytosis or early stage 0 CLL. Risk of transformation to CLL is 1%/year.  No B symptoms.  No palpable adenopathy or hepatosplenomegaly.  No issues with infections. Discuss plan for ongoing monitoring every 6 months. 5.  RTC in 6 months for MD assessment and labs (CBC with diff, CMP, LDH).   Honor Loh, NP  07/24/2017, 4:12 PM   I saw and evaluated the patient, participating in the key portions of the service and reviewing pertinent diagnostic studies and records.  I reviewed the nurse practitioner's note and agree with the findings and the plan.  The assessment and plan were discussed with the  patient.  A few  questions were asked by the patient and answered.   Nolon Stalls, MD 07/24/2017,4:12 PM

## 2017-08-06 ENCOUNTER — Encounter: Payer: Self-pay | Admitting: Hematology and Oncology

## 2017-09-22 ENCOUNTER — Other Ambulatory Visit: Payer: Self-pay | Admitting: Internal Medicine

## 2017-09-22 DIAGNOSIS — Z1231 Encounter for screening mammogram for malignant neoplasm of breast: Secondary | ICD-10-CM

## 2017-10-10 ENCOUNTER — Ambulatory Visit
Admission: RE | Admit: 2017-10-10 | Discharge: 2017-10-10 | Disposition: A | Discharge status: Home / Self Care | Payer: BLUE CROSS/BLUE SHIELD | Source: Ambulatory Visit | Attending: Internal Medicine | Admitting: Internal Medicine

## 2017-10-10 DIAGNOSIS — Z1231 Encounter for screening mammogram for malignant neoplasm of breast: Secondary | ICD-10-CM | POA: Diagnosis not present

## 2018-02-01 ENCOUNTER — Other Ambulatory Visit: Payer: BLUE CROSS/BLUE SHIELD

## 2018-02-01 ENCOUNTER — Ambulatory Visit: Payer: BLUE CROSS/BLUE SHIELD | Admitting: Hematology and Oncology

## 2018-02-05 ENCOUNTER — Telehealth: Payer: Self-pay | Admitting: *Deleted

## 2018-02-05 NOTE — Telephone Encounter (Signed)
Contacted regarding lung screening scan being due soon. Patient reports she is at work and will call back.

## 2018-02-06 ENCOUNTER — Inpatient Hospital Stay (HOSPITAL_BASED_OUTPATIENT_CLINIC_OR_DEPARTMENT_OTHER): Payer: BLUE CROSS/BLUE SHIELD | Admitting: Hematology and Oncology

## 2018-02-06 ENCOUNTER — Inpatient Hospital Stay: Payer: BLUE CROSS/BLUE SHIELD | Attending: Hematology and Oncology

## 2018-02-06 ENCOUNTER — Encounter: Payer: Self-pay | Admitting: Hematology and Oncology

## 2018-02-06 VITALS — BP 130/77 | HR 80 | Temp 95.7°F | Resp 18 | Wt 140.3 lb

## 2018-02-06 DIAGNOSIS — Z720 Tobacco use: Secondary | ICD-10-CM

## 2018-02-06 DIAGNOSIS — D72820 Lymphocytosis (symptomatic): Secondary | ICD-10-CM

## 2018-02-06 DIAGNOSIS — R911 Solitary pulmonary nodule: Secondary | ICD-10-CM | POA: Insufficient documentation

## 2018-02-06 LAB — CBC WITH DIFFERENTIAL/PLATELET
Basophils Absolute: 0.1 10*3/uL (ref 0–0.1)
Basophils Relative: 1 %
Eosinophils Absolute: 0.3 10*3/uL (ref 0–0.7)
Eosinophils Relative: 2 %
HCT: 41.5 % (ref 35.0–47.0)
Hemoglobin: 14.1 g/dL (ref 12.0–16.0)
Lymphocytes Relative: 57 %
Lymphs Abs: 7.9 10*3/uL — ABNORMAL HIGH (ref 1.0–3.6)
MCH: 32.4 pg (ref 26.0–34.0)
MCHC: 34 g/dL (ref 32.0–36.0)
MCV: 95.4 fL (ref 80.0–100.0)
Monocytes Absolute: 0.7 10*3/uL (ref 0.2–0.9)
Monocytes Relative: 5 %
Neutro Abs: 4.8 10*3/uL (ref 1.4–6.5)
Neutrophils Relative %: 35 %
Platelets: 240 10*3/uL (ref 150–440)
RBC: 4.35 MIL/uL (ref 3.80–5.20)
RDW: 13.9 % (ref 11.5–14.5)
WBC: 13.8 10*3/uL — ABNORMAL HIGH (ref 3.6–11.0)

## 2018-02-06 LAB — LACTATE DEHYDROGENASE: LDH: 152 U/L (ref 98–192)

## 2018-02-06 LAB — COMPREHENSIVE METABOLIC PANEL
ALT: 17 U/L (ref 0–44)
AST: 26 U/L (ref 15–41)
Albumin: 4.3 g/dL (ref 3.5–5.0)
Alkaline Phosphatase: 53 U/L (ref 38–126)
Anion gap: 8 (ref 5–15)
BUN: 14 mg/dL (ref 8–23)
CO2: 24 mmol/L (ref 22–32)
Calcium: 9.2 mg/dL (ref 8.9–10.3)
Chloride: 106 mmol/L (ref 98–111)
Creatinine, Ser: 0.85 mg/dL (ref 0.44–1.00)
GFR calc Af Amer: >60 mL/min (ref >60)
GFR calc non Af Amer: >60 mL/min (ref >60)
Glucose, Bld: 119 mg/dL — ABNORMAL HIGH (ref 70–99)
Potassium: 4.1 mmol/L (ref 3.5–5.1)
Sodium: 138 mmol/L (ref 135–145)
Total Bilirubin: 0.4 mg/dL (ref 0.3–1.2)
Total Protein: 6.8 g/dL (ref 6.5–8.1)

## 2018-02-06 NOTE — Progress Notes (Signed)
Patient offers no complaints today. 

## 2018-02-06 NOTE — Progress Notes (Signed)
[facility] day:  02/06/2018  Chief Complaint: Melissa Castro is a 62 y.o. 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  HPI:  The patient was last seen in the medical oncology clinic on 07/24/2017.  At that time, she denied any B symptoms.  She denied any bruising, bleeding or interval infections.  Exam was normal.  Labs were unremarkable.   During the interim,  She has done well.  She denies any B symptoms.  Weight is up 2 pounds.  She plans to schedule her chest CT.   Past Medical History:  Diagnosis Date  . Thyroid disease     History reviewed. No pertinent surgical history.  Family History  Problem Relation Age of Onset  . Breast cancer Neg Hx     Social History:  reports that she has been smoking. She has a 64.50 pack-year smoking history. She has never used smokeless tobacco. Her alcohol and drug histories are not on file.  She has smoked 1 1/2 packs/day x 40 years.  She denies any exposure to radiation or toxins.  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  Her husband had a liver transplant.  She lives in [place].  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  The patient is alone today.  Allergies: Not on File  Current Medications: Current Outpatient Medications  Medication Sig Dispense Refill  . Calcium Carbonate-Vitamin D (CALCIUM-VITAMIN D) 600-125 MG-UNIT TABS Take by mouth daily.    . Cholecalciferol (VITAMIN D3) 5000 units TABS Take by mouth.    . Ibuprofen-Diphenhydramine Cit (IBUPROFEN PM PO) Take by mouth at bedtime as needed.    Marland Kitchen levothyroxine (SYNTHROID, LEVOTHROID) 125 MCG tablet Take by mouth.    . simvastatin (ZOCOR) 20 MG tablet Take by mouth.     No current facility-administered medications for this visit.     Review of Systems:  GENERAL:  Feels good.  No fevers, sweats or weight loss.  Weight up 2 pounds. PERFORMANCE STATUS (ECOG):  0 HEENT:  No visual changes, runny nose, sore throat, mouth sores  or tenderness. Lungs: No shortness of breath.  No change in cough.  No hemoptysis. Cardiac:  No chest pain, palpitations, orthopnea, or PND. GI:  No nausea, vomiting, diarrhea, constipation, melena or hematochezia. GU:  No urgency, frequency, dysuria, or hematuria. Musculoskeletal:  No back pain.  No joint pain.  No muscle tenderness. Extremities:  No pain or swelling. Skin:  No rashes or skin changes. Neuro:  Chronic sciatica.  No headache, numbness or weakness, balance or coordination issues. Endocrine:  No diabetes.  Thyroid  Disease on Synthroid.  No hot flashes or night sweats. Psych:  No mood changes, depression or anxiety. Pain:  No focal pain. Review of systems:  All other systems reviewed and found to be negative.   Physical Exam: Blood pressure 130/77, pulse 80, temperature (!) 95.7 F (35.4 C), temperature source Tympanic, resp. rate 18, weight 140 lb 5 oz (63.6 kg). GENERAL:  Well developed, well nourished, woman sitting comfortably in the exam room in no acute distress. MENTAL STATUS:  Alert and oriented to person, place and time. HEAD:  Short dark hair with slight graying.  Normocephalic, atraumatic, face symmetric, no Cushingoid features. EYES:  Brown eyes.  Pupils equal round and reactive to light and accomodation.  No conjunctivitis or scleral icterus. ENT:  Oropharynx clear without lesion.  Tongue normal. Mucous membranes moist.  RESPIRATORY:  Clear to auscultation without  rales, wheezes or rhonchi. CARDIOVASCULAR:  Regular rate and rhythm without murmur, rub or gallop. ABDOMEN:  Soft, non-tender, with active bowel sounds, and no hepatosplenomegaly.  No masses. SKIN:  No rashes, ulcers or lesions. EXTREMITIES: No edema, no skin discoloration or tenderness.  No palpable cords. LYMPH NODES: No palpable cervical, supraclavicular, axillary or inguinal adenopathy  NEUROLOGICAL: Unremarkable. PSYCH:  Appropriate.    Appointment on 02/06/2018  Component Date Value Ref  Range Status  . LDH 02/06/2018 152  98 - 192 U/L Final   Performed at [facility], [address] [place], [place] [address]  . Sodium 02/06/2018 138  135 - 145 mmol/L Final  . Potassium 02/06/2018 4.1  3.5 - 5.1 mmol/L Final  . Chloride 02/06/2018 106  98 - 111 mmol/L Final  . CO2 02/06/2018 24  22 - 32 mmol/L Final  . Glucose, Bld 02/06/2018 119* 70 - 99 mg/dL Final  . BUN 02/06/2018 14  8 - 23 mg/dL Final  . Creatinine, Ser 02/06/2018 0.85  0.44 - 1.00 mg/dL Final  . Calcium 02/06/2018 9.2  8.9 - 10.3 mg/dL Final  . Total Protein 02/06/2018 6.8  6.5 - 8.1 g/dL Final  . Albumin 02/06/2018 4.3  3.5 - 5.0 g/dL Final  . AST 02/06/2018 26  15 - 41 U/L Final  . ALT 02/06/2018 17  0 - 44 U/L Final  . Alkaline Phosphatase 02/06/2018 53  38 - 126 U/L Final  . Total Bilirubin 02/06/2018 0.4  0.3 - 1.2 mg/dL Final  . GFR calc non Af Amer 02/06/2018 >60  >60 mL/min Final  . GFR calc Af Amer 02/06/2018 >60  >60 mL/min Final   Comment: (NOTE) The eGFR has been calculated using the CKD EPI equation. This calculation has not been validated in all clinical situations. eGFR's persistently <60 mL/min signify possible Chronic Kidney Disease.   Georgiann Hahn gap 02/06/2018 8  5 - 15 Final   Performed at [facility], [address] [place], [place] [address]  . WBC 02/06/2018 13.8* 3.6 - 11.0 K/uL Final  . RBC 02/06/2018 4.35  3.80 - 5.20 MIL/uL Final  . Hemoglobin 02/06/2018 14.1  12.0 - 16.0 g/dL Final  . HCT 02/06/2018 41.5  35.0 - 47.0 % Final  . MCV 02/06/2018 95.4  80.0 - 100.0 fL Final  . MCH 02/06/2018 32.4  26.0 - 34.0 pg Final  . MCHC 02/06/2018 34.0  32.0 - 36.0 g/dL Final  . RDW 02/06/2018 13.9  11.5 - 14.5 % Final  . Platelets 02/06/2018 240  150 - 440 K/uL Final  . Neutrophils Relative % 02/06/2018 35  % Final  . Neutro Abs 02/06/2018 4.8  1.4 - 6.5 K/uL Final  . Lymphocytes Relative 02/06/2018 57  % Final  . Lymphs Abs 02/06/2018 7.9* 1.0 - 3.6 K/uL Final  . Monocytes  Relative 02/06/2018 5  % Final  . Monocytes Absolute 02/06/2018 0.7  0.2 - 0.9 K/uL Final  . Eosinophils Relative 02/06/2018 2  % Final  . Eosinophils Absolute 02/06/2018 0.3  0 - 0.7 K/uL Final  . Basophils Relative 02/06/2018 1  % Final  . Basophils Absolute 02/06/2018 0.1  0 - 0.1 K/uL Final   Performed at [facility], [address] [place], [place] [address]    Assessment:  NAKIEA METZNER is a 62 y.o. 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  She has had lymphocytosis dating back to 12/03/2013.  Absolute lymphocyte count  has increased over time from 4410 to 7860.  Peripheral smear on 06/15/2015 revealed 33% lymphocytes and 24% variant lymphocytes.    CBC on 06/15/2016 revealed a hematocrit of 41.4, hemoglobin 14.1, MCV 95.4, platelet count 283,000, WBC 12,900.  Differential included 28% neutrophils, 61% lymphocytes, 7% monocytes and 1% basophils.  Absolute lymphocyte count was 7860.  CMP revealed a creatinine 0.8, calcium 9.4, protein 6.3, and albumen 4.3.  SPEP on 06/15/2016 revealed a 0.1 gm/dL monoclonal spike.  IgG was 618 (low).  IgA was 183 and IgM was 31.  Flow cytometry on 06/22/2016 revealed a CD5+, CD23+ monoclonal B cell population with kappa light chain restriction consistent with a CLL/SLL phenotype.  The population represented 97% of the B-cells and 28% of the leukocytes (3612).  There was notation that small populations (< 5,000) of clonal B -cell are classified as monoclonal B-cell lymphocytosis.  CD38 was not expressed on the clonal B-cells.  There was no loss of, or aberrant expression of, the pan T cell antigens to suggest a neoplastic T cell process.  CD4:CD8 was ratio 2.9.  There were no circulating blasts.  There was no immunophenotypicevidence of abnormal myeloid maturation.  There was no monoclonal or abnormal plasma cell population is detected.  Low dose chest CT on 02/07/2017 revealed Lung-RADS 2, benign appearance or behavior.  There was a tiny solid pulmonary nodule in the right middle lobe measuring 2.7 mm.  Continued annual screening was recommended in 12 months.  Bilateral mammogram on 08/16/2016 revealed no evidence of malignancy.  Symptomatically, she denies any B symptoms.  Weight is up 2 pounds.  Exam is stable.  Plan: 1.  Labs today:  CBC with diff, CMP, LDH. 2.  Monoclonal B-cell lymphocytosis:  Clinically doing well.  Counts are stable.  Exam normal.  Continue every 6 month surveillance. 3.  Right middle lobe nodule:  Discuss ongoing surveillance.  Patient plans to schedule follow-up chest CT.  Encourage smoking cessation. 4.  RTC in 6 months for MD assessment and las (CBC with diff, CMP, LDH).   Lequita Asal, MD  02/06/2018, 5:24 PM

## 2018-02-12 ENCOUNTER — Telehealth: Payer: Self-pay | Admitting: Nurse Practitioner

## 2018-03-16 ENCOUNTER — Encounter: Payer: Self-pay | Admitting: *Deleted

## 2018-03-16 ENCOUNTER — Telehealth: Payer: Self-pay | Admitting: *Deleted

## 2018-03-16 DIAGNOSIS — Z122 Encounter for screening for malignant neoplasm of respiratory organs: Secondary | ICD-10-CM

## 2018-03-16 NOTE — Telephone Encounter (Signed)
Patient has been notified that the annual lung cancer screening low dose CT scan is due currently or will be in the near future.  Confirmed that the patient is within the age range of 68-80, and asymptomatic, and currently exhibits no signs or symptoms of lung cancer.  Patient denies illness that would prevent curative treatment for lung cancer if found.  Verified smoking history, current smoker but decreased to 1 ppd, 64.5 pkyr history .  The shared decision making visit was completed on 02-17-17.  Patient is agreeable for the CT scan to be scheduled.  Will call patient back with date and time of appointment.

## 2018-03-19 ENCOUNTER — Telehealth: Payer: Self-pay | Admitting: *Deleted

## 2018-03-19 NOTE — Telephone Encounter (Signed)
Called patient to inform her of her appt for LDCT screening at  4:10 on 03-22-2018 @ [facility].

## 2018-03-22 ENCOUNTER — Ambulatory Visit
Admission: RE | Admit: 2018-03-22 | Discharge: 2018-03-22 | Disposition: A | Discharge status: Home / Self Care | Payer: BLUE CROSS/BLUE SHIELD | Source: Ambulatory Visit | Attending: Oncology | Admitting: Oncology

## 2018-03-22 DIAGNOSIS — Z122 Encounter for screening for malignant neoplasm of respiratory organs: Secondary | ICD-10-CM | POA: Diagnosis not present

## 2018-03-22 DIAGNOSIS — I700 Atherosclerosis of aorta: Secondary | ICD-10-CM | POA: Insufficient documentation

## 2018-03-22 DIAGNOSIS — J439 Emphysema, unspecified: Secondary | ICD-10-CM | POA: Insufficient documentation

## 2018-03-22 DIAGNOSIS — Z87891 Personal history of nicotine dependence: Secondary | ICD-10-CM | POA: Insufficient documentation

## 2018-03-28 ENCOUNTER — Encounter: Payer: Self-pay | Admitting: *Deleted

## 2018-08-08 NOTE — Progress Notes (Signed)
[facility] day:  08/09/2018  Chief Complaint: Melissa Castro is a 63 y.o. 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  HPI:  The patient was last seen in the medical oncology clinic on 02/06/2018.  At that time, she denied any B symptoms.  Weight was up 2 pounds.  Exam was stable.  Labs were unremarkable.  Low dose chest CT on 03/22/2018 revealed Lung-RADS 2, benign appearance or behavior.  Left apical nodular density was 4.7 mm (similar) and likely related to scarring.  Recommendation was to continue annual screening with low-dose chest CT without contrast in 12 months.  There was aortic atherosclerosis and emphysema  During the interim, she has felt great.  She denies any fevers, sweats or weight loss.  Weight is up 2 pounds.  She continues to smoke 1 pack/day.   Past Medical History:  Diagnosis Date  . Thyroid disease     History reviewed. No pertinent surgical history.  Family History  Problem Relation Age of Onset  . Breast cancer Neg Hx     Social History:  reports that she has been smoking. She has a 64.50 pack-year smoking history. She has never used smokeless tobacco. No history on file for alcohol and drug.  She has smoked 1 1/2 packs/day x 40 years.  She is currently smoking 1 ppd.  She denies any exposure to radiation or toxins.  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  Her husband had a liver transplant.  She lives in [place].  She likes to be called Melissa Castro.  The patient is alone today.  Allergies: No Known Allergies  Current Medications: Current Outpatient Medications  Medication Sig Dispense Refill  . aspirin 81 MG chewable tablet Chew 81 mg by mouth daily.    . Calcium Carbonate-Vitamin D (CALCIUM-VITAMIN D) 600-125 MG-UNIT TABS Take by mouth daily.    . Cholecalciferol (VITAMIN D3) 5000 units TABS Take by mouth daily.     . Ibuprofen-Diphenhydramine Cit (IBUPROFEN PM PO) Take by mouth at bedtime  as needed.    Marland Kitchen levothyroxine (SYNTHROID, LEVOTHROID) 125 MCG tablet Take 125 mcg by mouth daily before breakfast.     . simvastatin (ZOCOR) 20 MG tablet Take 20 mg by mouth daily at 6 PM.      No current facility-administered medications for this visit.     Review of Systems:  GENERAL:  Feels "great".  No fevers, sweats or weight loss.  Weight up 2 pounds. PERFORMANCE STATUS (ECOG):  0 HEENT:  Sinus drainage.  No visual changes, sore throat, mouth sores or tenderness. Lungs: No shortness of breath or cough.  No hemoptysis. Cardiac:  No chest pain, palpitations, orthopnea, or PND. GI:  No nausea, vomiting, diarrhea, constipation, melena or hematochezia. GU:  No urgency, frequency, dysuria, or hematuria. Musculoskeletal:  No back pain.  No joint pain.  No muscle tenderness. Extremities:  No pain or swelling. Skin:  No rashes or skin changes. Neuro:  Sciatica.  No headache, numbness or weakness, balance or coordination issues. Endocrine:  No diabetes.  Thyroid disease on Synthroid.  No hot flashes or night sweats. Psych:  No mood changes, depression or anxiety. Pain:  No focal pain. Review of systems:  All other systems reviewed and found to be negative.   Physical Exam: Blood pressure 117/75, pulse 75, temperature 97.9 F (36.6 C), temperature source Oral, resp. rate 16, weight 142 lb 4.9 oz (64.5 kg), SpO2 99 %. GENERAL:  Well developed, well nourished, woman sitting comfortably in the exam room in no acute distress. MENTAL STATUS:  Alert and oriented to person, place and time. HEAD:  Short dark hair with graying.  Normocephalic, atraumatic, face symmetric, no Cushingoid features. EYES:  Brown eyes.  Pupils equal round and reactive to light and accomodation.  No conjunctivitis or scleral icterus. ENT:  Hoarse.  Oropharynx clear without lesion.  Tongue normal. Mucous membranes moist.  RESPIRATORY:  Clear to auscultation without rales, wheezes or rhonchi. CARDIOVASCULAR:  Regular rate  and rhythm without murmur, rub or gallop. ABDOMEN:  Soft, non-tender, with active bowel sounds, and no hepatosplenomegaly.  No masses. SKIN:  No rashes, ulcers or lesions. EXTREMITIES: No edema, no skin discoloration or tenderness.  No palpable cords. LYMPH NODES: No palpable cervical, supraclavicular, axillary or inguinal adenopathy  NEUROLOGICAL: Unremarkable. PSYCH:  Appropriate.    Appointment on 08/09/2018  Component Date Value Ref Range Status  . LDH 08/09/2018 162  98 - 192 U/L Final   Performed at [facility], [address] [place], [place] [address]  . Sodium 08/09/2018 136  135 - 145 mmol/L Final  . Potassium 08/09/2018 4.3  3.5 - 5.1 mmol/L Final  . Chloride 08/09/2018 101  98 - 111 mmol/L Final  . CO2 08/09/2018 27  22 - 32 mmol/L Final  . Glucose, Bld 08/09/2018 106* 70 - 99 mg/dL Final  . BUN 08/09/2018 13  8 - 23 mg/dL Final  . Creatinine, Ser 08/09/2018 0.73  0.44 - 1.00 mg/dL Final  . Calcium 08/09/2018 9.0  8.9 - 10.3 mg/dL Final  . Total Protein 08/09/2018 7.1  6.5 - 8.1 g/dL Final  . Albumin 08/09/2018 4.5  3.5 - 5.0 g/dL Final  . AST 08/09/2018 27  15 - 41 U/L Final  . ALT 08/09/2018 22  0 - 44 U/L Final  . Alkaline Phosphatase 08/09/2018 56  38 - 126 U/L Final  . Total Bilirubin 08/09/2018 0.6  0.3 - 1.2 mg/dL Final  . GFR calc non Af Amer 08/09/2018 >60  >60 mL/min Final  . GFR calc Af Amer 08/09/2018 >60  >60 mL/min Final  . Anion gap 08/09/2018 8  5 - 15 Final   Performed at [facility], [address] [place], [place] [address]  . WBC 08/09/2018 14.4* 4.0 - 10.5 K/uL Final  . RBC 08/09/2018 4.27  3.87 - 5.11 MIL/uL Final  . Hemoglobin 08/09/2018 14.1  12.0 - 15.0 g/dL Final  . HCT 08/09/2018 40.7  36.0 - 46.0 % Final  . MCV 08/09/2018 95.3  80.0 - 100.0 fL Final  . MCH 08/09/2018 33.0  26.0 - 34.0 pg Final  . MCHC 08/09/2018 34.6  30.0 - 36.0 g/dL Final  . RDW 08/09/2018 12.7  11.5 - 15.5 % Final  . Platelets 08/09/2018  247  150 - 400 K/uL Final  . nRBC 08/09/2018 0.0  0.0 - 0.2 % Final  . Neutrophils Relative % 08/09/2018 31  % Final  . Neutro Abs 08/09/2018 4.5  1.7 - 7.7 K/uL Final  . Lymphocytes Relative 08/09/2018 61  % Final  . Lymphs Abs 08/09/2018 8.7* 0.7 - 4.0 K/uL Final  . Monocytes Relative 08/09/2018 5  % Final  . Monocytes Absolute 08/09/2018 0.8  0.1 - 1.0 K/uL Final  . Eosinophils Relative 08/09/2018 2  % Final  . Eosinophils Absolute 08/09/2018 0.3  0.0 - 0.5 K/uL Final  . Basophils Relative 08/09/2018 1  % Final  .  Basophils Absolute 08/09/2018 0.1  0.0 - 0.1 K/uL Final  . Immature Granulocytes 08/09/2018 0  % Final  . Abs Immature Granulocytes 08/09/2018 0.02  0.00 - 0.07 K/uL Final   Performed at [facility], [address] [place], [place] [address]    Assessment:  ARRAYAH CONNORS is a 63 y.o. 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  She has had lymphocytosis dating back to 12/03/2013.  Absolute lymphocyte count has increased over time from 4410 to 7860.  Peripheral smear on 06/15/2015 revealed 33% lymphocytes and 24% variant lymphocytes.    CBC on 06/15/2016 revealed a hematocrit of 41.4, hemoglobin 14.1, MCV 95.4, platelet count 283,000, WBC 12,900.  Differential included 28% neutrophils, 61% lymphocytes, 7% monocytes and 1% basophils.  Absolute lymphocyte count was 7860.  CMP revealed a creatinine 0.8, calcium 9.4, protein 6.3, and albumen 4.3.  SPEP on 06/15/2016 revealed a 0.1 gm/dL monoclonal spike.  IgG was 618 (low).  IgA was 183 and IgM was 31.  Flow cytometry on 06/22/2016 revealed a CD5+, CD23+ monoclonal B cell population with kappa light chain restriction consistent with a CLL/SLL phenotype.  The population represented 97% of the B-cells and 28% of the leukocytes (3612).  There was notation that small populations (< 5,000) of clonal B -cell are classified as monoclonal B-cell lymphocytosis.  CD38 was not expressed on the clonal  B-cells.  There was no loss of, or aberrant expression of, the pan T cell antigens to suggest a neoplastic T cell process.  CD4:CD8 was ratio 2.9.  There were no circulating blasts.  There was no immunophenotypicevidence of abnormal myeloid maturation.  There was no monoclonal or abnormal plasma cell population is detected.  Low dose chest CT on 02/07/2017 revealed Lung-RADS 2, benign appearance or behavior. There was a tiny solid pulmonary nodule in the right middle lobe measuring 2.7 mm.  Low dose chest CT on 03/22/2018 revealed Lung-RADS 2, benign appearance or behavior.  Left apical nodular density was 4.7 mm (similar) and likely related to scarring.    Bilateral mammogram on 08/16/2016 revealed no evidence of malignancy.  Bilateral mammogram on 10/10/2017 revealed no evidence of malignancy.  Symptomatically, she denies any fevers, sweats or weight loss.  Exam is unremarkable.  Plan: 1.  Labs today:  CBC with diff, CMP, LDH. 2.  Monoclonal B-cell lymphocytosis  WBC is 14,400.  ALC is 8700.  Clinically, she continues to do well.  Continue 6 month surveillance. 3.  Right middle lobe nodule  Low dose chest CT in 03/2018 revealed a 4.7 mm left apical nodular density (likely benign).  Anticipate follow-up low dose chest CT in 03/2019.  Patient smoking 1 pack/day.  Encourage smoking cessation. 4.   RTC in 6 months for MD assessment and labs (CBC with diff, CMP, LDH).  I discussed the assessment and treatment plan with the patient.  The patient was provided an opportunity to ask questions and all were answered.  The patient agreed with the plan and demonstrated an understanding of the instructions.  The patient was advised to call back or seek an in person evaluation if the symptoms worsen or if the condition fails to improve as anticipated.    Lequita Asal, MD  08/09/2018 , 4:00 PM

## 2018-08-09 ENCOUNTER — Inpatient Hospital Stay: Payer: BC Managed Care – PPO | Attending: Hematology and Oncology

## 2018-08-09 ENCOUNTER — Ambulatory Visit: Payer: BLUE CROSS/BLUE SHIELD | Admitting: Hematology and Oncology

## 2018-08-09 ENCOUNTER — Encounter: Payer: Self-pay | Admitting: Hematology and Oncology

## 2018-08-09 ENCOUNTER — Other Ambulatory Visit: Payer: BLUE CROSS/BLUE SHIELD

## 2018-08-09 ENCOUNTER — Inpatient Hospital Stay (HOSPITAL_BASED_OUTPATIENT_CLINIC_OR_DEPARTMENT_OTHER): Payer: BC Managed Care – PPO | Admitting: Hematology and Oncology

## 2018-08-09 VITALS — BP 117/75 | HR 75 | Temp 97.9°F | Resp 16 | Wt 142.3 lb

## 2018-08-09 DIAGNOSIS — D72820 Lymphocytosis (symptomatic): Secondary | ICD-10-CM

## 2018-08-09 DIAGNOSIS — Z720 Tobacco use: Secondary | ICD-10-CM | POA: Insufficient documentation

## 2018-08-09 DIAGNOSIS — R911 Solitary pulmonary nodule: Secondary | ICD-10-CM

## 2018-08-09 LAB — COMPREHENSIVE METABOLIC PANEL
ALT: 22 U/L (ref 0–44)
AST: 27 U/L (ref 15–41)
Albumin: 4.5 g/dL (ref 3.5–5.0)
Alkaline Phosphatase: 56 U/L (ref 38–126)
Anion gap: 8 (ref 5–15)
BUN: 13 mg/dL (ref 8–23)
CO2: 27 mmol/L (ref 22–32)
Calcium: 9 mg/dL (ref 8.9–10.3)
Chloride: 101 mmol/L (ref 98–111)
Creatinine, Ser: 0.73 mg/dL (ref 0.44–1.00)
GFR calc Af Amer: >60 mL/min (ref >60)
GFR calc non Af Amer: >60 mL/min (ref >60)
Glucose, Bld: 106 mg/dL — ABNORMAL HIGH (ref 70–99)
Potassium: 4.3 mmol/L (ref 3.5–5.1)
Sodium: 136 mmol/L (ref 135–145)
Total Bilirubin: 0.6 mg/dL (ref 0.3–1.2)
Total Protein: 7.1 g/dL (ref 6.5–8.1)

## 2018-08-09 LAB — CBC WITH DIFFERENTIAL/PLATELET
Abs Immature Granulocytes: 0.02 10*3/uL (ref 0.00–0.07)
Basophils Absolute: 0.1 10*3/uL (ref 0.0–0.1)
Basophils Relative: 1 %
Eosinophils Absolute: 0.3 10*3/uL (ref 0.0–0.5)
Eosinophils Relative: 2 %
HCT: 40.7 % (ref 36.0–46.0)
Hemoglobin: 14.1 g/dL (ref 12.0–15.0)
Immature Granulocytes: 0 %
Lymphocytes Relative: 61 %
Lymphs Abs: 8.7 10*3/uL — ABNORMAL HIGH (ref 0.7–4.0)
MCH: 33 pg (ref 26.0–34.0)
MCHC: 34.6 g/dL (ref 30.0–36.0)
MCV: 95.3 fL (ref 80.0–100.0)
Monocytes Absolute: 0.8 10*3/uL (ref 0.1–1.0)
Monocytes Relative: 5 %
Neutro Abs: 4.5 10*3/uL (ref 1.7–7.7)
Neutrophils Relative %: 31 %
Platelets: 247 10*3/uL (ref 150–400)
RBC: 4.27 MIL/uL (ref 3.87–5.11)
RDW: 12.7 % (ref 11.5–15.5)
WBC: 14.4 10*3/uL — ABNORMAL HIGH (ref 4.0–10.5)
nRBC: 0 % (ref 0.0–0.2)

## 2018-08-09 LAB — LACTATE DEHYDROGENASE: LDH: 162 U/L (ref 98–192)

## 2018-08-09 NOTE — Progress Notes (Signed)
Pt here for follow up. Denies any concerns at this time.  

## 2018-12-21 ENCOUNTER — Other Ambulatory Visit: Payer: Self-pay | Admitting: Internal Medicine

## 2018-12-21 DIAGNOSIS — Z1231 Encounter for screening mammogram for malignant neoplasm of breast: Secondary | ICD-10-CM

## 2018-12-26 ENCOUNTER — Other Ambulatory Visit: Payer: Self-pay

## 2018-12-26 ENCOUNTER — Ambulatory Visit
Admission: RE | Admit: 2018-12-26 | Discharge: 2018-12-26 | Disposition: A | Discharge status: Home / Self Care | Payer: BC Managed Care – PPO | Source: Ambulatory Visit | Attending: Internal Medicine | Admitting: Internal Medicine

## 2018-12-26 DIAGNOSIS — Z1231 Encounter for screening mammogram for malignant neoplasm of breast: Secondary | ICD-10-CM

## 2019-02-04 NOTE — Progress Notes (Signed)
[facility]  [address], [address] [place], [place] [address] Phone: [phone]  Fax: [phone]   Clinic Day:  02/06/2019  Referring physician: Adin Hector, MD  Chief Complaint: Melissa Castro is a 63 y.o. 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  HPI: The patient was last seen in the medical oncology clinic on 08/09/2018. At that time, she denied any fevers, sweats or weight loss.  Exam was unremarkable.  CBC revealed a hematocrit of 40.7, hemoglobin 14.1, MCV 95.3, platelets 247,000, white count 14,400 (ANC 4500; ALC 8700).   Bilateral screening mammogram on 12/26/2018 revealed no evidence of malignancy.   During the interim, she is feeling "great." She denies any fevers, sweats, or weight loss. She denies any allergies, rhinorrhea, cough, or congestion. She denies any lumps, bumps, bruising, or bleeding.   She continues to smoke 1 pdd. She has a CBC and CMP drawn every 6 months at her PCP.    Past Medical History:  Diagnosis Date  . Thyroid disease     History reviewed. No pertinent surgical history.  Family History  Problem Relation Age of Onset  . Breast cancer Neg Hx     Social History:  reports that she has been smoking. She has a 64.50 pack-year smoking history. She has never used smokeless tobacco. No history on file for alcohol and drug. She has smoked 1 1/2 packs/day x 40 years. She is currently smoking 1 ppd. She denies any exposure to radiation or toxins. 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 She lives in [place]. 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 She is currently watching her son's two husky dogs while he is deployed in the [employer]. The patient is alone today.  Allergies: No Known Allergies  Current Medications: Current Outpatient Medications  Medication Sig Dispense Refill  . aspirin 81 MG chewable tablet Chew 81 mg by mouth daily.    . Calcium Carbonate-Vitamin  D (CALCIUM-VITAMIN D) 600-125 MG-UNIT TABS Take by mouth daily.    . Cholecalciferol (VITAMIN D3) 5000 units TABS Take by mouth daily.     Marland Kitchen levothyroxine (SYNTHROID, LEVOTHROID) 125 MCG tablet Take 125 mcg by mouth daily before breakfast.     . simvastatin (ZOCOR) 20 MG tablet Take 20 mg by mouth daily at 6 PM.     . Ibuprofen-Diphenhydramine Cit (IBUPROFEN PM PO) Take by mouth at bedtime as needed.     No current facility-administered medications for this visit.     Review of Systems  Constitutional: Positive for weight loss (1lb). Negative for chills, diaphoresis, fever and malaise/fatigue.       Feels "great".  HENT: Negative.  Negative for congestion, hearing loss, sinus pain and sore throat.   Eyes: Negative.  Negative for blurred vision.  Respiratory: Negative.  Negative for cough, shortness of breath and wheezing.   Cardiovascular: Negative.  Negative for chest pain, palpitations, orthopnea, leg swelling and PND.  Gastrointestinal: Negative.  Negative for abdominal pain, blood in stool, constipation, diarrhea, melena, nausea and vomiting.  Genitourinary: Negative.  Negative for dysuria, frequency, hematuria and urgency.  Musculoskeletal: Negative.  Negative for back pain, joint pain and myalgias.  Skin: Negative.  Negative for rash.  Neurological: Negative.  Negative for dizziness, tingling, sensory change, weakness and headaches.  Endo/Heme/Allergies: Negative.  Does not bruise/bleed easily.       Thyroid disease on Synthroid.  Psychiatric/Behavioral: Negative.  Negative for depression, memory loss  and substance abuse. The patient is not nervous/anxious and does not have insomnia.   All other systems reviewed and are negative.  Performance status (ECOG): 0  Vitals Blood pressure 121/64, pulse 74, temperature (!) 96.9 F (36.1 C), temperature source Tympanic, resp. rate 18, height 5' 6.5" (1.689 m), weight 141 lb 8.6 oz (64.2 kg), SpO2 100 %.   Physical Exam  Constitutional:  She is oriented to person, place, and time. She appears well-developed and well-nourished. No distress.  HENT:  Head: Normocephalic and atraumatic.  Mouth/Throat: Oropharynx is clear and moist. No oropharyngeal exudate.  Hoarse. Short dark hair with graying.    Eyes: Pupils are equal, round, and reactive to light. Conjunctivae and EOM are normal. No scleral icterus.  Glasses.  Brown eyes.  Neck: Normal range of motion. Neck supple.  Cardiovascular: Normal rate, regular rhythm and normal heart sounds.  No murmur heard. Pulmonary/Chest: Effort normal and breath sounds normal. No respiratory distress. She has no wheezes.  Abdominal: Soft. Bowel sounds are normal. She exhibits no distension. There is no splenomegaly or hepatomegaly. There is no abdominal tenderness.  Musculoskeletal: Normal range of motion.        General: No edema.  Lymphadenopathy:    She has no cervical adenopathy.    She has no axillary adenopathy.       Right: No inguinal and no supraclavicular adenopathy present.       Left: No inguinal and no supraclavicular adenopathy present.  Neurological: She is alert and oriented to person, place, and time.  Skin: Skin is warm and dry. She is not diaphoretic. No erythema.  Psychiatric: She has a normal mood and affect. Her behavior is normal. Judgment and thought content normal.  Nursing note and vitals reviewed.   Appointment on 02/06/2019  Component Date Value Ref Range Status  . LDH 02/06/2019 183  98 - 192 U/L Final   Performed at [facility], [address], [place], [place] [address]  . Sodium 02/06/2019 138  135 - 145 mmol/L Final  . Potassium 02/06/2019 4.5  3.5 - 5.1 mmol/L Final  . Chloride 02/06/2019 101  98 - 111 mmol/L Final  . CO2 02/06/2019 26  22 - 32 mmol/L Final  . Glucose, Bld 02/06/2019 110* 70 - 99 mg/dL Final  . BUN 02/06/2019 11  8 - 23 mg/dL Final  . Creatinine, Ser 02/06/2019 0.75  0.44 - 1.00 mg/dL Final  . Calcium 02/06/2019 9.6   8.9 - 10.3 mg/dL Final  . Total Protein 02/06/2019 7.0  6.5 - 8.1 g/dL Final  . Albumin 02/06/2019 4.6  3.5 - 5.0 g/dL Final  . AST 02/06/2019 29  15 - 41 U/L Final  . ALT 02/06/2019 19  0 - 44 U/L Final  . Alkaline Phosphatase 02/06/2019 55  38 - 126 U/L Final  . Total Bilirubin 02/06/2019 0.5  0.3 - 1.2 mg/dL Final  . GFR calc non Af Amer 02/06/2019 >60  >60 mL/min Final  . GFR calc Af Amer 02/06/2019 >60  >60 mL/min Final  . Anion gap 02/06/2019 11  5 - 15 Final   Performed at [facility], [address] [place], [place] [address]  . WBC 02/06/2019 15.7* 4.0 - 10.5 K/uL Final  . RBC 02/06/2019 4.51  3.87 - 5.11 MIL/uL Final  . Hemoglobin 02/06/2019 14.5  12.0 - 15.0 g/dL Final  . HCT 02/06/2019 42.7  36.0 - 46.0 % Final  . MCV 02/06/2019 94.7  80.0 -  100.0 fL Final  . MCH 02/06/2019 32.2  26.0 - 34.0 pg Final  . MCHC 02/06/2019 34.0  30.0 - 36.0 g/dL Final  . RDW 02/06/2019 12.9  11.5 - 15.5 % Final  . Platelets 02/06/2019 238  150 - 400 K/uL Final  . nRBC 02/06/2019 0.0  0.0 - 0.2 % Final  . Neutrophils Relative % 02/06/2019 28  % Final  . Neutro Abs 02/06/2019 4.4  1.7 - 7.7 K/uL Final  . Lymphocytes Relative 02/06/2019 64  % Final  . Lymphs Abs 02/06/2019 10.1* 0.7 - 4.0 K/uL Final  . Monocytes Relative 02/06/2019 5  % Final  . Monocytes Absolute 02/06/2019 0.8  0.1 - 1.0 K/uL Final  . Eosinophils Relative 02/06/2019 2  % Final  . Eosinophils Absolute 02/06/2019 0.3  0.0 - 0.5 K/uL Final  . Basophils Relative 02/06/2019 1  % Final  . Basophils Absolute 02/06/2019 0.1  0.0 - 0.1 K/uL Final  . Immature Granulocytes 02/06/2019 0  % Final  . Abs Immature Granulocytes 02/06/2019 0.04  0.00 - 0.07 K/uL Final   Performed at [facility], [address] [place], [place] [address]    Assessment:  Melissa Castro is a 63 y.o. 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  She has had lymphocytosis dating back to 12/03/2013.  Absolute  lymphocyte count has increased over time from 4410 to 7860.  Peripheral smear on 06/15/2015 revealed 33% lymphocytes and 24% variant lymphocytes.    CBC on 06/15/2016 revealed a hematocrit of 41.4, hemoglobin 14.1, MCV 95.4, platelet count 283,000, WBC 12,900.  Differential included 28% neutrophils, 61% lymphocytes, 7% monocytes and 1% basophils.  Absolute lymphocyte count was 7860.  CMP revealed a creatinine 0.8, calcium 9.4, protein 6.3, and albumen 4.3.  SPEP on 06/15/2016 revealed a 0.1 gm/dL monoclonal spike.  IgG was 618 (low).  IgA was 183 and IgM was 31.  Flow cytometry on 06/22/2016 revealed a CD5+, CD23+ monoclonal B cell population with kappa light chain restriction consistent with a CLL/SLL phenotype.  The population represented 97% of the B-cells and 28% of the leukocytes (3612).  There was notation that small populations (< 5,000) of clonal B -cell are classified as monoclonal B-cell lymphocytosis.  CD38 was not expressed on the clonal B-cells.  There was no loss of, or aberrant expression of, the pan T cell antigens to suggest a neoplastic T cell process.  CD4:CD8 was ratio 2.9.  There were no circulating blasts.  There was no immunophenotypicevidence of abnormal myeloid maturation.  There was no monoclonal or abnormal plasma cell population is detected.  Low dose chest CT on 02/07/2017 revealed Lung-RADS 2, benign appearance or behavior. There was a tiny solid pulmonary nodule in the right middle lobe measuring 2.7 mm.  Low dose chest CT on 03/22/2018 revealed Lung-RADS 2, benign appearance or behavior.  Left apical nodular density was 4.7 mm (similar) and likely related to scarring.    Bilateral mammogram on 08/16/2016 revealed no evidence of malignancy. Bilateral mammogram on 10/10/2017 revealed no evidence of malignancy. Bilateral mammogram on 12/26/2018 revealed no evidence of malignancy.   Symptomatically, she feels "great".  She continues to smoke 1 ppd.  Exam is stable.   Plan: 1.  Labs today:  CBC with diff, CMP, LDH. 2.  Monoclonal B-cell lymphocytosis             WBC is 15,700.  ALC is 10,100.  Clinically, she continues to do well.             Continue to monitor. 3.  Right middle lobe nodule             Low dose chest CT on 03/22/2018 revealed a 4.7 mm left apical nodular density (likely benign).               Continue low-dose chest CT program.   Contact Burgess Estelle, RN.    Anticipate next chest CT in 03/2019.             Encourage smoking cessation. 4.   Patient requests labs drawn by Dr Caryl Comes (CBC with diff, CMP, LDH) every 6 months. 5.   RTC in 1 year for MD assessment and review of labs.  I discussed the assessment and treatment plan with the patient.  The patient was provided an opportunity to ask questions and all were answered.  The patient agreed with the plan and demonstrated an understanding of the instructions.  The patient was advised to call back if the symptoms worsen or if the condition fails to improve as anticipated.   Lequita Asal, MD, PhD    02/06/2019, 2:08 PM  I, Cloyde Reams Dorshimer, am acting as Education administrator for [employer]. Mike Gip, MD, PhD.  I, Melissa C. Mike Gip, MD, have reviewed the above documentation for accuracy and completeness, and I agree with the above.

## 2019-02-05 ENCOUNTER — Other Ambulatory Visit: Payer: Self-pay

## 2019-02-06 ENCOUNTER — Inpatient Hospital Stay (HOSPITAL_BASED_OUTPATIENT_CLINIC_OR_DEPARTMENT_OTHER): Payer: BC Managed Care – PPO | Admitting: Hematology and Oncology

## 2019-02-06 ENCOUNTER — Encounter: Payer: Self-pay | Admitting: Hematology and Oncology

## 2019-02-06 ENCOUNTER — Inpatient Hospital Stay: Payer: BC Managed Care – PPO | Attending: Hematology and Oncology

## 2019-02-06 VITALS — BP 121/64 | HR 74 | Temp 96.9°F | Resp 18 | Ht 66.5 in | Wt 141.5 lb

## 2019-02-06 DIAGNOSIS — E079 Disorder of thyroid, unspecified: Secondary | ICD-10-CM | POA: Insufficient documentation

## 2019-02-06 DIAGNOSIS — Z79899 Other long term (current) drug therapy: Secondary | ICD-10-CM | POA: Insufficient documentation

## 2019-02-06 DIAGNOSIS — D72820 Lymphocytosis (symptomatic): Secondary | ICD-10-CM | POA: Insufficient documentation

## 2019-02-06 DIAGNOSIS — Z803 Family history of malignant neoplasm of breast: Secondary | ICD-10-CM | POA: Insufficient documentation

## 2019-02-06 DIAGNOSIS — Z7982 Long term (current) use of aspirin: Secondary | ICD-10-CM | POA: Diagnosis not present

## 2019-02-06 DIAGNOSIS — F17210 Nicotine dependence, cigarettes, uncomplicated: Secondary | ICD-10-CM | POA: Diagnosis not present

## 2019-02-06 DIAGNOSIS — R911 Solitary pulmonary nodule: Secondary | ICD-10-CM

## 2019-02-06 LAB — COMPREHENSIVE METABOLIC PANEL
ALT: 19 U/L (ref 0–44)
AST: 29 U/L (ref 15–41)
Albumin: 4.6 g/dL (ref 3.5–5.0)
Alkaline Phosphatase: 55 U/L (ref 38–126)
Anion gap: 11 (ref 5–15)
BUN: 11 mg/dL (ref 8–23)
CO2: 26 mmol/L (ref 22–32)
Calcium: 9.6 mg/dL (ref 8.9–10.3)
Chloride: 101 mmol/L (ref 98–111)
Creatinine, Ser: 0.75 mg/dL (ref 0.44–1.00)
GFR calc Af Amer: >60 mL/min (ref >60)
GFR calc non Af Amer: >60 mL/min (ref >60)
Glucose, Bld: 110 mg/dL — ABNORMAL HIGH (ref 70–99)
Potassium: 4.5 mmol/L (ref 3.5–5.1)
Sodium: 138 mmol/L (ref 135–145)
Total Bilirubin: 0.5 mg/dL (ref 0.3–1.2)
Total Protein: 7 g/dL (ref 6.5–8.1)

## 2019-02-06 LAB — CBC WITH DIFFERENTIAL/PLATELET
Abs Immature Granulocytes: 0.04 10*3/uL (ref 0.00–0.07)
Basophils Absolute: 0.1 10*3/uL (ref 0.0–0.1)
Basophils Relative: 1 %
Eosinophils Absolute: 0.3 10*3/uL (ref 0.0–0.5)
Eosinophils Relative: 2 %
HCT: 42.7 % (ref 36.0–46.0)
Hemoglobin: 14.5 g/dL (ref 12.0–15.0)
Immature Granulocytes: 0 %
Lymphocytes Relative: 64 %
Lymphs Abs: 10.1 10*3/uL — ABNORMAL HIGH (ref 0.7–4.0)
MCH: 32.2 pg (ref 26.0–34.0)
MCHC: 34 g/dL (ref 30.0–36.0)
MCV: 94.7 fL (ref 80.0–100.0)
Monocytes Absolute: 0.8 10*3/uL (ref 0.1–1.0)
Monocytes Relative: 5 %
Neutro Abs: 4.4 10*3/uL (ref 1.7–7.7)
Neutrophils Relative %: 28 %
Platelets: 238 10*3/uL (ref 150–400)
RBC: 4.51 MIL/uL (ref 3.87–5.11)
RDW: 12.9 % (ref 11.5–15.5)
WBC: 15.7 10*3/uL — ABNORMAL HIGH (ref 4.0–10.5)
nRBC: 0 % (ref 0.0–0.2)

## 2019-02-06 LAB — LACTATE DEHYDROGENASE: LDH: 183 U/L (ref 98–192)

## 2019-02-06 NOTE — Progress Notes (Signed)
No new changes noted today 

## 2019-02-07 ENCOUNTER — Other Ambulatory Visit: Payer: BLUE CROSS/BLUE SHIELD

## 2019-02-07 ENCOUNTER — Ambulatory Visit: Payer: BLUE CROSS/BLUE SHIELD | Admitting: Hematology and Oncology

## 2019-03-19 ENCOUNTER — Telehealth: Payer: Self-pay | Admitting: *Deleted

## 2019-03-19 DIAGNOSIS — Z87891 Personal history of nicotine dependence: Secondary | ICD-10-CM

## 2019-03-19 DIAGNOSIS — Z122 Encounter for screening for malignant neoplasm of respiratory organs: Secondary | ICD-10-CM

## 2019-03-19 NOTE — Telephone Encounter (Signed)
Patient has been notified that lung cancer screening CT scan is due currently or will be in the near future. Confirmed that patient is within the appropriate age range and asymptomatic at this time (no signs or symptoms of lung cancer). Patient denies illness that would prevent curative treatment for lung cancer if found. Verified smoking history (current smoker, 1 ppd). Patient is agreeable for CT scan being scheduled, prefers later afternoon appointments.

## 2019-03-22 ENCOUNTER — Telehealth: Payer: Self-pay | Admitting: *Deleted

## 2019-03-22 NOTE — Addendum Note (Signed)
Addended by: Lieutenant Diego on: 03/22/2019 03:32 PM   Modules accepted: Orders

## 2019-03-22 NOTE — Telephone Encounter (Signed)
error 

## 2019-03-22 NOTE — Telephone Encounter (Signed)
Current smoker, 65.5 pack year

## 2019-03-28 ENCOUNTER — Ambulatory Visit
Admission: RE | Admit: 2019-03-28 | Discharge: 2019-03-28 | Disposition: A | Discharge status: Home / Self Care | Payer: BC Managed Care – PPO | Source: Ambulatory Visit | Attending: Oncology | Admitting: Oncology

## 2019-03-28 ENCOUNTER — Other Ambulatory Visit: Payer: Self-pay

## 2019-03-28 DIAGNOSIS — Z87891 Personal history of nicotine dependence: Secondary | ICD-10-CM | POA: Insufficient documentation

## 2019-03-28 DIAGNOSIS — Z122 Encounter for screening for malignant neoplasm of respiratory organs: Secondary | ICD-10-CM | POA: Insufficient documentation

## 2019-04-03 ENCOUNTER — Encounter: Payer: Self-pay | Admitting: *Deleted

## 2020-01-15 ENCOUNTER — Other Ambulatory Visit: Payer: Self-pay | Admitting: Internal Medicine

## 2020-01-15 DIAGNOSIS — Z1231 Encounter for screening mammogram for malignant neoplasm of breast: Secondary | ICD-10-CM

## 2020-02-04 NOTE — Progress Notes (Signed)
[facility]  [address] [place], [place] [address] Phone: [phone]  Fax: [phone]   Clinic Day:  02/05/2020  Referring physician: Adin Hector, MD  Chief Complaint: Melissa Castro is a 64 y.o. 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  HPI: The patient was last seen in the medical oncology clinic on 02/06/2019. At that time, Melissa Castro felt "great".  Melissa Castro continued to smoke 1 ppd.  Exam was stable. Hematocrit was 42.7, hemoglobin 14.5, platelets 238,000, WBC 15,700 (ANC 4,400). CMP was normal. LDH was 183.   Low dose chest CT on 03/28/2019 revealed Lung-RADS 2, benign appearance or behavior. There was aortic atherosclerosis and emphysema.  Labs on 07/11/2019 revealed a hematocrit 42.9, hemoglobin 14.5, platelets 203,000, WBC 14,100 ([place] 2,320). CMP was normal.  CBC on 01/28/2020 at [facility] revealed a hematocrit of 42.5, hemoglobin 14.3, MCV 97, platelets 219,000, WBC 16,700 ([place] 2850; ALC 12,810).  Creatinine was 0.8 with normal liver function tests.  Screening mammogram is scheduled for 02/06/2020.  During the interim, Melissa Castro has been doing well and has had no problems. Denies B symptoms, lumps, bumps, bruising, or bleeding. Melissa Castro is still taking thyroid medication. Melissa Castro smokes about 1 pack per day. Melissa Castro is not interested in the smoking cessation program at this time.   Past Medical History:  Diagnosis Date  . Thyroid disease     History reviewed. No pertinent surgical history.  Family History  Problem Relation Age of Onset  . Breast cancer Neg Hx     Social History:  reports that Melissa Castro has been smoking. Melissa Castro has a 64.50 pack-year smoking history. Melissa Castro has never used smokeless tobacco. No history on file for alcohol use and drug use. Melissa Castro has smoked 1 1/2 packs/day x 40 years. Melissa Castro is currently smoking 1 ppd. Melissa Castro denies any exposure to radiation or toxins. 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 Her husband had a liver  transplant. Melissa Castro lives in [place]. Melissa Castro likes to be called Melissa Castro. Melissa Castro is currently watching her son's two husky dogs while he is deployed in the [employer]. The patient is alone today.  Allergies: No Known Allergies  Current Medications: Current Outpatient Medications  Medication Sig Dispense Refill  . aspirin 81 MG chewable tablet Chew 81 mg by mouth daily.    . Calcium Carbonate-Vitamin D (CALCIUM-VITAMIN D) 600-125 MG-UNIT TABS Take by mouth daily.    . Cholecalciferol (VITAMIN D3) 5000 units TABS Take by mouth daily.     . Ibuprofen-Diphenhydramine Cit (IBUPROFEN PM PO) Take by mouth at bedtime as needed.    Marland Kitchen levothyroxine (SYNTHROID) 125 MCG tablet Take by mouth.    . levothyroxine (SYNTHROID, LEVOTHROID) 125 MCG tablet Take 125 mcg by mouth daily before breakfast.     . simvastatin (ZOCOR) 20 MG tablet Take by mouth.    . simvastatin (ZOCOR) 20 MG tablet Take 20 mg by mouth daily at 6 PM.      No current facility-administered medications for this visit.    Review of Systems  Constitutional: Positive for weight loss (3 lbs). Negative for chills, diaphoresis, fever and malaise/fatigue.       Feels "pretty good".  HENT: Negative.  Negative for congestion, ear discharge, ear pain, hearing loss, nosebleeds, sinus pain, sore throat and tinnitus.   Eyes: Negative.  Negative for blurred vision.  Respiratory: Negative.  Negative for cough, hemoptysis, sputum production and shortness of breath.   Cardiovascular: Negative.  Negative for chest pain,  palpitations, orthopnea, leg swelling and PND.  Gastrointestinal: Negative.  Negative for abdominal pain, blood in stool, constipation, diarrhea, heartburn, melena, nausea and vomiting.  Genitourinary: Negative.  Negative for dysuria, frequency, hematuria and urgency.  Musculoskeletal: Negative.  Negative for back pain, joint pain, myalgias and neck pain.  Skin: Negative.  Negative for rash.  Neurological: Negative.  Negative for dizziness, tingling,  sensory change, weakness and headaches.  Endo/Heme/Allergies: Negative.  Does not bruise/bleed easily.       Thyroid disease on Synthroid.  Psychiatric/Behavioral: Negative.  Negative for depression and memory loss. The patient is not nervous/anxious and does not have insomnia.   All other systems reviewed and are negative.  Performance status (ECOG): 0  Vitals Blood pressure (!) 121/59, pulse 76, temperature (!) 96.7 F (35.9 C), temperature source Tympanic, resp. rate 16, weight 138 lb 3.7 oz (62.7 kg), SpO2 100 %.   Physical Exam Vitals and nursing note reviewed.  Constitutional:      General: Melissa Castro is not in acute distress.    Appearance: Melissa Castro is well-developed. Melissa Castro is not diaphoretic.  HENT:     Head: Normocephalic and atraumatic.     Mouth/Throat:     Mouth: Mucous membranes are moist.     Pharynx: Oropharynx is clear. No oropharyngeal exudate.  Eyes:     General: No scleral icterus.    Extraocular Movements: Extraocular movements intact.     Conjunctiva/sclera: Conjunctivae normal.     Pupils: Pupils are equal, round, and reactive to light.     Comments: Glasses.  Brown eyes.  Cardiovascular:     Rate and Rhythm: Normal rate and regular rhythm.     Heart sounds: Normal heart sounds. No murmur heard.   Pulmonary:     Effort: Pulmonary effort is normal. No respiratory distress.     Breath sounds: Normal breath sounds. No wheezing or rales.  Chest:     Chest wall: No tenderness.  Abdominal:     General: Bowel sounds are normal. There is no distension.     Palpations: Abdomen is soft. There is no hepatomegaly, splenomegaly or mass.     Tenderness: There is no abdominal tenderness. There is no guarding or rebound.  Musculoskeletal:        General: No swelling or tenderness. Normal range of motion.     Cervical back: Normal range of motion and neck supple.  Lymphadenopathy:     Head:     Right side of head: No preauricular, posterior auricular or occipital adenopathy.      Left side of head: No preauricular, posterior auricular or occipital adenopathy.     Cervical: No cervical adenopathy.     Upper Body:     Right upper body: No supraclavicular or axillary adenopathy.     Left upper body: No supraclavicular or axillary adenopathy.     Lower Body: No right inguinal adenopathy. No left inguinal adenopathy.  Skin:    General: Skin is warm and dry.     Findings: No erythema.  Neurological:     Mental Status: Melissa Castro is alert and oriented to person, place, and time.  Psychiatric:        Behavior: Behavior normal.        Thought Content: Thought content normal.        Judgment: Judgment normal.    No visits with results within 3 Day(s) from this visit.  Latest known visit with results is:  Appointment on 02/06/2019  Component Date Value Ref Range Status  .  LDH 02/06/2019 183  98 - 192 U/L Final   Performed at [facility], [address] [place], [place] [address]  . Sodium 02/06/2019 138  135 - 145 mmol/L Final  . Potassium 02/06/2019 4.5  3.5 - 5.1 mmol/L Final  . Chloride 02/06/2019 101  98 - 111 mmol/L Final  . CO2 02/06/2019 26  22 - 32 mmol/L Final  . Glucose, Bld 02/06/2019 110* 70 - 99 mg/dL Final  . BUN 02/06/2019 11  8 - 23 mg/dL Final  . Creatinine, Ser 02/06/2019 0.75  0.44 - 1.00 mg/dL Final  . Calcium 02/06/2019 9.6  8.9 - 10.3 mg/dL Final  . Total Protein 02/06/2019 7.0  6.5 - 8.1 g/dL Final  . Albumin 02/06/2019 4.6  3.5 - 5.0 g/dL Final  . AST 02/06/2019 29  15 - 41 U/L Final  . ALT 02/06/2019 19  0 - 44 U/L Final  . Alkaline Phosphatase 02/06/2019 55  38 - 126 U/L Final  . Total Bilirubin 02/06/2019 0.5  0.3 - 1.2 mg/dL Final  . GFR calc non Af Amer 02/06/2019 >60  >60 mL/min Final  . GFR calc Af Amer 02/06/2019 >60  >60 mL/min Final  . Anion gap 02/06/2019 11  5 - 15 Final   Performed at [facility], [address] [place], [place] [address]  . WBC 02/06/2019 15.7* 4.0 - 10.5 K/uL Final  . RBC 02/06/2019  4.51  3.87 - 5.11 MIL/uL Final  . Hemoglobin 02/06/2019 14.5  12.0 - 15.0 g/dL Final  . HCT 02/06/2019 42.7  36 - 46 % Final  . MCV 02/06/2019 94.7  80.0 - 100.0 fL Final  . MCH 02/06/2019 32.2  26.0 - 34.0 pg Final  . MCHC 02/06/2019 34.0  30.0 - 36.0 g/dL Final  . RDW 02/06/2019 12.9  11.5 - 15.5 % Final  . Platelets 02/06/2019 238  150 - 400 K/uL Final  . nRBC 02/06/2019 0.0  0.0 - 0.2 % Final  . Neutrophils Relative % 02/06/2019 28  % Final  . Neutro Abs 02/06/2019 4.4  1.7 - 7.7 K/uL Final  . Lymphocytes Relative 02/06/2019 64  % Final  . Lymphs Abs 02/06/2019 10.1* 0.7 - 4.0 K/uL Final  . Monocytes Relative 02/06/2019 5  % Final  . Monocytes Absolute 02/06/2019 0.8  0 - 1 K/uL Final  . Eosinophils Relative 02/06/2019 2  % Final  . Eosinophils Absolute 02/06/2019 0.3  0 - 0 K/uL Final  . Basophils Relative 02/06/2019 1  % Final  . Basophils Absolute 02/06/2019 0.1  0 - 0 K/uL Final  . Immature Granulocytes 02/06/2019 0  % Final  . Abs Immature Granulocytes 02/06/2019 0.04  0.00 - 0.07 K/uL Final   Performed at [facility], [address] [place], [place] [address]    Assessment:  Melissa Castro is a 64 y.o. 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  Melissa Castro has had lymphocytosis dating back to 12/03/2013.  Absolute lymphocyte count has increased over time from 4410 to 7860.  Peripheral smear on 06/15/2015 revealed 33% lymphocytes and 24% variant lymphocytes.    CBC on 06/15/2016 revealed a hematocrit of 41.4, hemoglobin 14.1, MCV 95.4, platelet count 283,000, WBC 12,900.  Differential included 28% neutrophils, 61% lymphocytes, 7% monocytes and 1% basophils.  Absolute lymphocyte count was 7860.  CMP revealed a creatinine 0.8, calcium 9.4, protein 6.3, and albumen 4.3.  SPEP on 06/15/2016 revealed a 0.1 gm/dL  monoclonal spike.  IgG was 618 (low).  IgA was 183 and IgM was 31.  Flow cytometry on 06/22/2016 revealed a CD5+, CD23+ monoclonal B cell  population with kappa light chain restriction consistent with a CLL/SLL phenotype.  The population represented 97% of the B-cells and 28% of the leukocytes (3612).  There was notation that small populations (< 5,000) of clonal B -cell are classified as monoclonal B-cell lymphocytosis.  CD38 was not expressed on the clonal B-cells.  There was no loss of, or aberrant expression of, the pan T cell antigens to suggest a neoplastic T cell process.  CD4:CD8 was ratio 2.9.  There were no circulating blasts.  There was no immunophenotypicevidence of abnormal myeloid maturation.  There was no monoclonal or abnormal plasma cell population is detected.  Low dose chest CT on 02/07/2017 revealed Lung-RADS 2, benign appearance or behavior. There was a tiny solid pulmonary nodule in the right middle lobe measuring 2.7 mm.  Low dose chest CT on 03/22/2018 revealed Lung-RADS 2, benign appearance or behavior.  Left apical nodular density was 4.7 mm (similar) and likely related to scarring.  Low dose chest CT on 03/28/2019 revealed Lung-RADS 2, benign appearance or behavior. There was aortic atherosclerosis and emphysema.  Bilateral mammogram on 08/16/2016 revealed no evidence of malignancy. Bilateral mammogram on 10/10/2017 revealed no evidence of malignancy. Bilateral mammogram on 12/26/2018 revealed no evidence of malignancy.  Screening bilateral mammogram is scheduled for 02/06/2020.  Symptomatically, Melissa Castro feels "pretty good".  Melissa Castro denies any B symptoms.  Exam reveals no adenopathy or hepatosplenomegaly.  Plan: 1.   Review labs from [place] on 01/28/2020. 2.  Monoclonal B-cell lymphocytosis             WBC is 16,700 with an ANC of 2850 and an ALC of 12,810.             Clinically, Melissa Castro continues to do well.  Exam reveals no adenopathy or hepatosplenomegaly.  Continue to monitor. 3.  Right middle lobe nodule             Low dose chest CT on 03/28/2019 was benign.              RN to reach out to Melinda Crutch, RN  regarding low-dose chest CT next month.   4.   Patient requests labs to be drawn at Dr. Olin Pia office (CBC with diff, CMP, LDH) every 6 months. 5.   RTC in 1 year for MD assessment and review of labs.  I discussed the assessment and treatment plan with the patient.  The patient was provided an opportunity to ask questions and all were answered.  The patient agreed with the plan and demonstrated an understanding of the instructions.  The patient was advised to call back if the symptoms worsen or if the condition fails to improve as anticipated.   Lequita Asal, MD, PhD    02/05/2020, 1:38 PM  I, Mirian Mo Tufford, am acting as Education administrator for [employer]. Mike Gip, MD, PhD.  I, Melissa C. Mike Gip, MD, have reviewed the above documentation for accuracy and completeness, and I agree with the above.

## 2020-02-05 ENCOUNTER — Inpatient Hospital Stay: Payer: BC Managed Care – PPO | Attending: Hematology and Oncology | Admitting: Hematology and Oncology

## 2020-02-05 ENCOUNTER — Telehealth: Payer: Self-pay | Admitting: *Deleted

## 2020-02-05 ENCOUNTER — Other Ambulatory Visit: Payer: Self-pay

## 2020-02-05 ENCOUNTER — Encounter: Payer: Self-pay | Admitting: Hematology and Oncology

## 2020-02-05 VITALS — BP 121/59 | HR 76 | Temp 96.7°F | Resp 16 | Wt 138.2 lb

## 2020-02-05 DIAGNOSIS — Z79899 Other long term (current) drug therapy: Secondary | ICD-10-CM | POA: Diagnosis not present

## 2020-02-05 DIAGNOSIS — R911 Solitary pulmonary nodule: Secondary | ICD-10-CM

## 2020-02-05 DIAGNOSIS — Z791 Long term (current) use of non-steroidal anti-inflammatories (NSAID): Secondary | ICD-10-CM | POA: Insufficient documentation

## 2020-02-05 DIAGNOSIS — R918 Other nonspecific abnormal finding of lung field: Secondary | ICD-10-CM | POA: Diagnosis present

## 2020-02-05 DIAGNOSIS — D72820 Lymphocytosis (symptomatic): Secondary | ICD-10-CM | POA: Insufficient documentation

## 2020-02-05 DIAGNOSIS — F17210 Nicotine dependence, cigarettes, uncomplicated: Secondary | ICD-10-CM | POA: Diagnosis not present

## 2020-02-05 DIAGNOSIS — E079 Disorder of thyroid, unspecified: Secondary | ICD-10-CM | POA: Insufficient documentation

## 2020-02-05 DIAGNOSIS — Z7982 Long term (current) use of aspirin: Secondary | ICD-10-CM | POA: Diagnosis not present

## 2020-02-05 NOTE — Telephone Encounter (Signed)
Received referral for low dose lung cancer screening CT scan. Message left at phone number listed in EMR for patient to call me back to facilitate scheduling scan.  

## 2020-02-05 NOTE — Progress Notes (Signed)
Patient here for oncology follow-up appointment, expresses no complaints or concerns at this time.    

## 2020-02-06 ENCOUNTER — Ambulatory Visit
Admission: RE | Admit: 2020-02-06 | Discharge: 2020-02-06 | Disposition: A | Discharge status: Home / Self Care | Payer: BC Managed Care – PPO | Source: Ambulatory Visit | Attending: Internal Medicine | Admitting: Internal Medicine

## 2020-02-06 DIAGNOSIS — Z1231 Encounter for screening mammogram for malignant neoplasm of breast: Secondary | ICD-10-CM | POA: Diagnosis present

## 2020-02-19 LAB — COLOGUARD: COLOGUARD: POSITIVE — AB

## 2020-03-14 ENCOUNTER — Telehealth: Payer: Self-pay | Admitting: *Deleted

## 2020-03-14 NOTE — Telephone Encounter (Signed)
Pt notified that lung cancer screening imaging is due currently or in the near future. Patient currently smoke one pack of cigarettes per day. Appointment scheduled for 04/09/2020 at 4:30pm.

## 2020-03-17 ENCOUNTER — Other Ambulatory Visit: Payer: Self-pay | Admitting: *Deleted

## 2020-03-17 DIAGNOSIS — Z122 Encounter for screening for malignant neoplasm of respiratory organs: Secondary | ICD-10-CM

## 2020-03-17 DIAGNOSIS — Z87891 Personal history of nicotine dependence: Secondary | ICD-10-CM

## 2020-03-17 NOTE — Progress Notes (Signed)
Current smoker, 66.5 pack year

## 2020-04-09 ENCOUNTER — Ambulatory Visit
Admission: RE | Admit: 2020-04-09 | Discharge: 2020-04-09 | Disposition: A | Discharge status: Home / Self Care | Payer: BC Managed Care – PPO | Source: Ambulatory Visit | Attending: Nurse Practitioner | Admitting: Nurse Practitioner

## 2020-04-09 ENCOUNTER — Other Ambulatory Visit: Payer: Self-pay

## 2020-04-09 DIAGNOSIS — Z122 Encounter for screening for malignant neoplasm of respiratory organs: Secondary | ICD-10-CM | POA: Diagnosis present

## 2020-04-09 DIAGNOSIS — Z87891 Personal history of nicotine dependence: Secondary | ICD-10-CM | POA: Insufficient documentation

## 2020-04-14 ENCOUNTER — Encounter: Payer: Self-pay | Admitting: *Deleted

## 2021-02-04 ENCOUNTER — Inpatient Hospital Stay: Payer: BC Managed Care – PPO

## 2021-02-04 ENCOUNTER — Encounter: Payer: Self-pay | Admitting: Oncology

## 2021-02-04 ENCOUNTER — Other Ambulatory Visit: Payer: Self-pay

## 2021-02-04 ENCOUNTER — Inpatient Hospital Stay: Payer: BC Managed Care – PPO | Attending: Hematology and Oncology | Admitting: Oncology

## 2021-02-04 VITALS — BP 123/60 | HR 83 | Temp 97.0°F | Resp 18 | Wt 137.9 lb

## 2021-02-04 DIAGNOSIS — Z79899 Other long term (current) drug therapy: Secondary | ICD-10-CM | POA: Diagnosis not present

## 2021-02-04 DIAGNOSIS — D72820 Lymphocytosis (symptomatic): Secondary | ICD-10-CM | POA: Diagnosis present

## 2021-02-04 DIAGNOSIS — Z87891 Personal history of nicotine dependence: Secondary | ICD-10-CM

## 2021-02-04 DIAGNOSIS — F17210 Nicotine dependence, cigarettes, uncomplicated: Secondary | ICD-10-CM | POA: Diagnosis not present

## 2021-02-04 DIAGNOSIS — Z7982 Long term (current) use of aspirin: Secondary | ICD-10-CM | POA: Insufficient documentation

## 2021-02-04 DIAGNOSIS — E079 Disorder of thyroid, unspecified: Secondary | ICD-10-CM | POA: Insufficient documentation

## 2021-02-04 LAB — CBC WITH DIFFERENTIAL/PLATELET
Abs Immature Granulocytes: 0.03 10*3/uL (ref 0.00–0.07)
Basophils Absolute: 0.1 10*3/uL (ref 0.0–0.1)
Basophils Relative: 1 %
Eosinophils Absolute: 0.2 10*3/uL (ref 0.0–0.5)
Eosinophils Relative: 1 %
HCT: 40.9 % (ref 36.0–46.0)
Hemoglobin: 14 g/dL (ref 12.0–15.0)
Immature Granulocytes: 0 %
Lymphocytes Relative: 64 %
Lymphs Abs: 9.5 10*3/uL — ABNORMAL HIGH (ref 0.7–4.0)
MCH: 32 pg (ref 26.0–34.0)
MCHC: 34.2 g/dL (ref 30.0–36.0)
MCV: 93.6 fL (ref 80.0–100.0)
Monocytes Absolute: 0.7 10*3/uL (ref 0.1–1.0)
Monocytes Relative: 5 %
Neutro Abs: 4.2 10*3/uL (ref 1.7–7.7)
Neutrophils Relative %: 29 %
Platelets: 259 10*3/uL (ref 150–400)
RBC: 4.37 MIL/uL (ref 3.87–5.11)
RDW: 13 % (ref 11.5–15.5)
Smear Review: NORMAL
WBC Morphology: ABNORMAL
WBC: 14.6 10*3/uL — ABNORMAL HIGH (ref 4.0–10.5)
nRBC: 0 % (ref 0.0–0.2)

## 2021-02-04 NOTE — Progress Notes (Signed)
Pt here for follow up. No new concerns voiced.   

## 2021-02-04 NOTE — Progress Notes (Signed)
[facility]  [address], [address] [place], [place] [address] Phone: [phone]  Fax: [phone]   Clinic Day:  02/04/2021  Referring physician: Adin Hector, MD  Chief Complaint: Melissa Castro is a 65 y.o. 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  INTERVAL HISTORY Patient previously followed up by Dr.Corcoran, patient switched care to me on 02/04/21 Extensive medical record review was performed by me Denies weight loss, fever, chills, fatigue, night sweats.     Past Medical History:  Diagnosis Date   Thyroid disease     History reviewed. No pertinent surgical history.  Family History  Problem Relation Age of Onset   Breast cancer Neg Hx     Social History:  reports that she has been smoking. She has a 64.50 pack-year smoking history. She has never used smokeless tobacco. No history on file for alcohol use and drug use. She has smoked 1 1/2 packs/day x 40 years. She is currently smoking 1 ppd. She denies any exposure to radiation or toxins. 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 She lives in [place].  Allergies: No Known Allergies  Current Medications: Current Outpatient Medications  Medication Sig Dispense Refill   aspirin 81 MG chewable tablet Chew 81 mg by mouth daily.     Calcium Carbonate-Vitamin D (CALCIUM-VITAMIN D) 600-125 MG-UNIT TABS Take by mouth daily.     Cholecalciferol (VITAMIN D3) 5000 units TABS Take by mouth daily.      Ibuprofen-Diphenhydramine Cit (IBUPROFEN PM PO) Take by mouth at bedtime as needed.     levothyroxine (SYNTHROID, LEVOTHROID) 125 MCG tablet Take 125 mcg by mouth daily before breakfast.      simvastatin (ZOCOR) 20 MG tablet Take 20 mg by mouth daily at 6 PM.      No current facility-administered medications for this visit.    Review of Systems  Constitutional:  Negative for chills, diaphoresis, fever, malaise/fatigue and weight loss.   HENT: Negative.  Negative for congestion, ear discharge, ear pain, hearing loss, nosebleeds, sinus pain, sore throat and tinnitus.   Eyes: Negative.  Negative for blurred vision.  Respiratory: Negative.  Negative for cough, hemoptysis, sputum production and shortness of breath.   Cardiovascular: Negative.  Negative for chest pain, palpitations, orthopnea, leg swelling and PND.  Gastrointestinal: Negative.  Negative for abdominal pain, blood in stool, constipation, diarrhea, heartburn, melena, nausea and vomiting.  Genitourinary: Negative.  Negative for dysuria, frequency, hematuria and urgency.  Musculoskeletal: Negative.  Negative for back pain, joint pain, myalgias and neck pain.  Skin: Negative.  Negative for rash.  Neurological: Negative.  Negative for dizziness, tingling, sensory change, weakness and headaches.  Endo/Heme/Allergies: Negative.  Does not bruise/bleed easily.       Thyroid disease on Synthroid.  Psychiatric/Behavioral: Negative.  Negative for depression and memory loss. The patient is not nervous/anxious and does not have insomnia.   All other systems reviewed and are negative. Performance status (ECOG): 0  Vitals Blood pressure 123/60, pulse 83, temperature (!) 97 F (36.1 C), resp. rate 18, weight 137 lb 14.4 oz (62.5 kg).   Physical Exam Vitals and nursing note reviewed.  Constitutional:      General: She is not in acute distress.    Appearance: She is well-developed. She is not diaphoretic.  HENT:     Head: Normocephalic and atraumatic.     Mouth/Throat:     Mouth: Mucous membranes are moist.  Pharynx: Oropharynx is clear. No oropharyngeal exudate.  Eyes:     General: No scleral icterus.    Pupils: Pupils are equal, round, and reactive to light.     Comments: Glasses.  Brown eyes.  Cardiovascular:     Rate and Rhythm: Normal rate and regular rhythm.     Heart sounds: Normal heart sounds. No murmur heard. Pulmonary:     Effort: Pulmonary effort is  normal. No respiratory distress.     Breath sounds: Normal breath sounds. No wheezing or rales.  Chest:     Chest wall: No tenderness.  Breasts:    Right: No axillary adenopathy or supraclavicular adenopathy.     Left: No axillary adenopathy or supraclavicular adenopathy.  Abdominal:     General: Bowel sounds are normal. There is no distension.     Palpations: Abdomen is soft. There is no hepatomegaly, splenomegaly or mass.     Tenderness: There is no abdominal tenderness. There is no guarding or rebound.  Musculoskeletal:        General: No swelling or tenderness. Normal range of motion.     Cervical back: Normal range of motion and neck supple.  Lymphadenopathy:     Head:     Right side of head: No preauricular, posterior auricular or occipital adenopathy.     Left side of head: No preauricular, posterior auricular or occipital adenopathy.     Cervical: No cervical adenopathy.     Upper Body:     Right upper body: No supraclavicular or axillary adenopathy.     Left upper body: No supraclavicular or axillary adenopathy.     Lower Body: No right inguinal adenopathy. No left inguinal adenopathy.  Skin:    General: Skin is warm and dry.     Findings: No erythema.  Neurological:     Mental Status: She is alert and oriented to person, place, and time.  Psychiatric:        Mood and Affect: Mood normal.   Labs are reviewed and discussed with patient. CBC    Component Value Date/Time   WBC 14.6 (H) 02/04/2021 1404   RBC 4.37 02/04/2021 1404   HGB 14.0 02/04/2021 1404   HCT 40.9 02/04/2021 1404   PLT 259 02/04/2021 1404   MCV 93.6 02/04/2021 1404   MCH 32.0 02/04/2021 1404   MCHC 34.2 02/04/2021 1404   RDW 13.0 02/04/2021 1404   LYMPHSABS 9.5 (H) 02/04/2021 1404   MONOABS 0.7 02/04/2021 1404   EOSABS 0.2 02/04/2021 1404   BASOSABS 0.1 02/04/2021 1404   CMP Latest Ref Rng & Units 02/06/2019 08/09/2018 02/06/2018  Glucose 70 - 99 mg/dL 110(H) 106(H) 119(H)  BUN 8 - 23 mg/dL '11 13  14  '$ Creatinine 0.44 - 1.00 mg/dL 0.75 0.73 0.85  Sodium 135 - 145 mmol/L 138 136 138  Potassium 3.5 - 5.1 mmol/L 4.5 4.3 4.1  Chloride 98 - 111 mmol/L 101 101 106  CO2 22 - 32 mmol/L '26 27 24  '$ Calcium 8.9 - 10.3 mg/dL 9.6 9.0 9.2  Total Protein 6.5 - 8.1 g/dL 7.0 7.1 6.8  Total Bilirubin 0.3 - 1.2 mg/dL 0.5 0.6 0.4  Alkaline Phos 38 - 126 U/L 55 56 53  AST 15 - 41 U/L '29 27 26  '$ ALT 0 - 44 U/L '19 22 17     '$ Assessment and Plan 1. Monoclonal B-cell lymphocytosis   2. Personal history of tobacco use, presenting hazards to health    # monoclonal B cell lymphocytosis Check  CBC and flowcytometry.  Wbc 14.6, stable. pre dominantly lymphocytosis Continue observation.    #Tobacco use, continue annual lung cancer screening. Refer to [place] lung cancer screening program.  RTC in 1 year for MD assessment and review of labs.  I discussed the assessment and treatment plan with the patient.  The patient was provided an opportunity to ask questions and all were answered.  The patient agreed with the plan and demonstrated an understanding of the instructions.  The patient was advised to call back if the symptoms worsen or if the condition fails to improve as anticipated.   Earlie Server, MD, PhD Hematology Oncology [place] at [facility] 02/04/2021

## 2021-02-08 ENCOUNTER — Other Ambulatory Visit: Payer: Self-pay | Admitting: Internal Medicine

## 2021-02-08 DIAGNOSIS — Z1231 Encounter for screening mammogram for malignant neoplasm of breast: Secondary | ICD-10-CM

## 2021-02-08 LAB — COMP PANEL: LEUKEMIA/LYMPHOMA: Immunophenotypic Profile: 35

## 2021-02-13 ENCOUNTER — Other Ambulatory Visit: Payer: Self-pay | Admitting: Oncology

## 2021-02-13 DIAGNOSIS — D72820 Lymphocytosis (symptomatic): Secondary | ICD-10-CM

## 2021-02-13 DIAGNOSIS — C9110 Chronic lymphocytic leukemia of B-cell type not having achieved remission: Secondary | ICD-10-CM

## 2021-02-15 ENCOUNTER — Telehealth: Payer: Self-pay

## 2021-02-15 NOTE — Telephone Encounter (Signed)
Patient informed and voiced understanding. Follow up appts given.

## 2021-02-15 NOTE — Telephone Encounter (Signed)
Please move up appts to 6 months - lab/MD in [place]. I will contact pt with appt details when I call to give her results.

## 2021-02-15 NOTE — Telephone Encounter (Signed)
-----   Message from Earlie Server, MD sent at 02/13/2021 11:28 PM EDT ----- Please move her appt to 6 months.  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  Lab md 6 months, I will take of the labs. Thanks.

## 2021-02-24 ENCOUNTER — Ambulatory Visit
Admission: RE | Admit: 2021-02-24 | Discharge: 2021-02-24 | Disposition: A | Discharge status: Home / Self Care | Payer: BC Managed Care – PPO | Source: Ambulatory Visit | Attending: Internal Medicine | Admitting: Internal Medicine

## 2021-02-24 ENCOUNTER — Other Ambulatory Visit: Payer: Self-pay

## 2021-02-24 DIAGNOSIS — Z1231 Encounter for screening mammogram for malignant neoplasm of breast: Secondary | ICD-10-CM | POA: Insufficient documentation

## 2021-06-07 ENCOUNTER — Other Ambulatory Visit: Payer: Self-pay | Admitting: *Deleted

## 2021-06-07 DIAGNOSIS — F17210 Nicotine dependence, cigarettes, uncomplicated: Secondary | ICD-10-CM

## 2021-06-07 DIAGNOSIS — Z87891 Personal history of nicotine dependence: Secondary | ICD-10-CM

## 2021-06-15 ENCOUNTER — Other Ambulatory Visit: Payer: Self-pay

## 2021-06-15 ENCOUNTER — Ambulatory Visit
Admission: RE | Admit: 2021-06-15 | Discharge: 2021-06-15 | Disposition: A | Discharge status: Home / Self Care | Payer: Medicare HMO | Source: Ambulatory Visit | Attending: Acute Care | Admitting: Acute Care

## 2021-06-15 DIAGNOSIS — F17210 Nicotine dependence, cigarettes, uncomplicated: Secondary | ICD-10-CM | POA: Insufficient documentation

## 2021-06-15 DIAGNOSIS — Z87891 Personal history of nicotine dependence: Secondary | ICD-10-CM | POA: Diagnosis present

## 2021-06-24 ENCOUNTER — Other Ambulatory Visit: Payer: Self-pay | Admitting: Acute Care

## 2021-06-24 DIAGNOSIS — F17210 Nicotine dependence, cigarettes, uncomplicated: Secondary | ICD-10-CM

## 2021-06-24 DIAGNOSIS — Z87891 Personal history of nicotine dependence: Secondary | ICD-10-CM

## 2021-08-05 IMAGING — MG DIGITAL SCREENING BILAT W/ TOMO W/ CAD
8 series · 8 of 24 positions shown · non-contrast
Comparison: Previous exam(s).

CLINICAL DATA: Screening.

EXAM:
DIGITAL SCREENING BILATERAL MAMMOGRAM WITH TOMO AND CAD

[L MLO synth-2D]
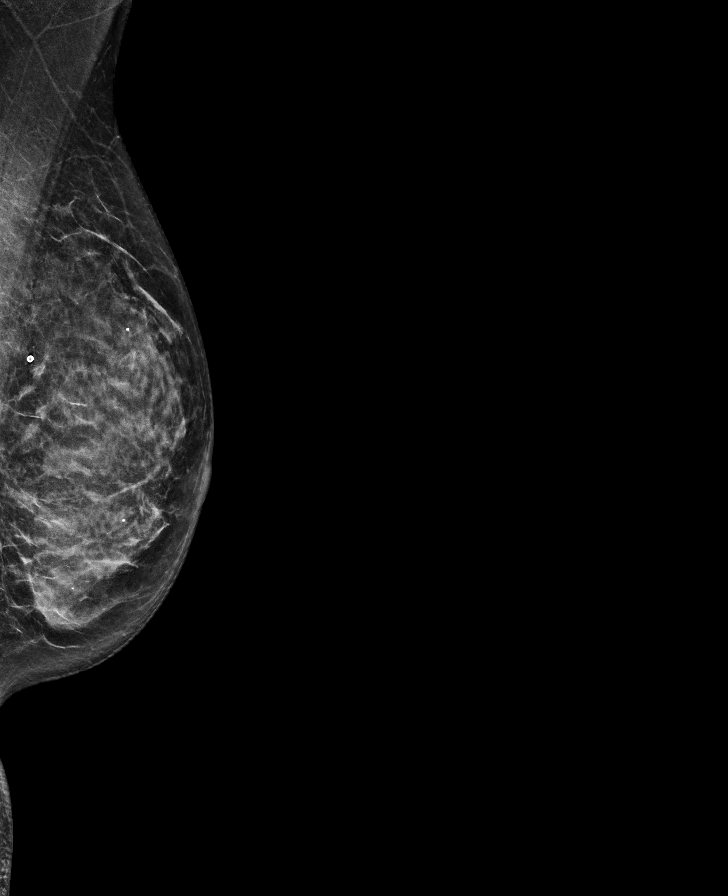

[R CC synth-2D]
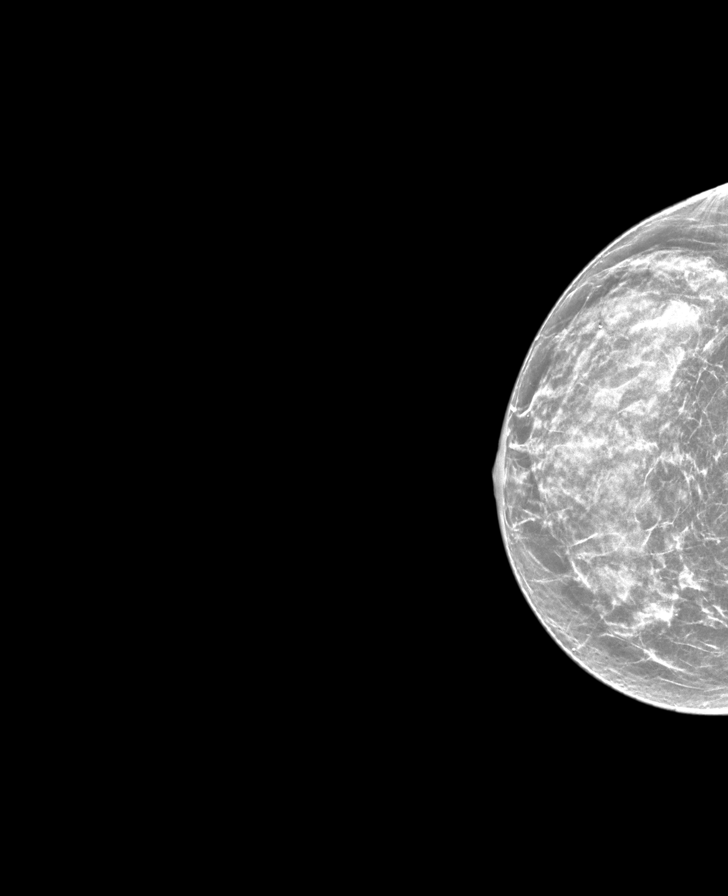

[L CC synth-2D]
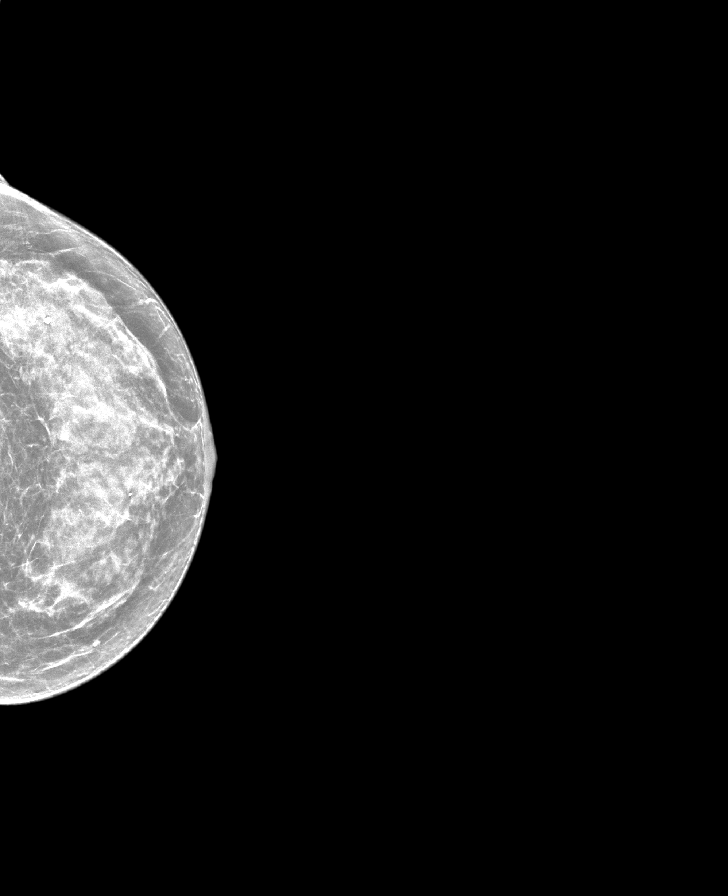

[R MLO synth-2D]
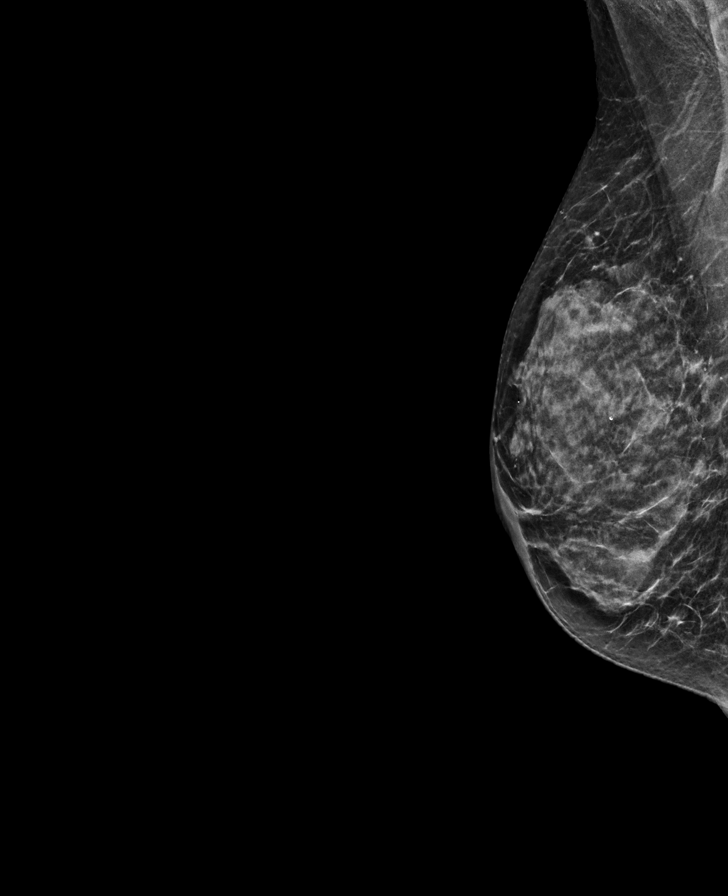

[L MLO tomo · tomo slice 28/55.0]
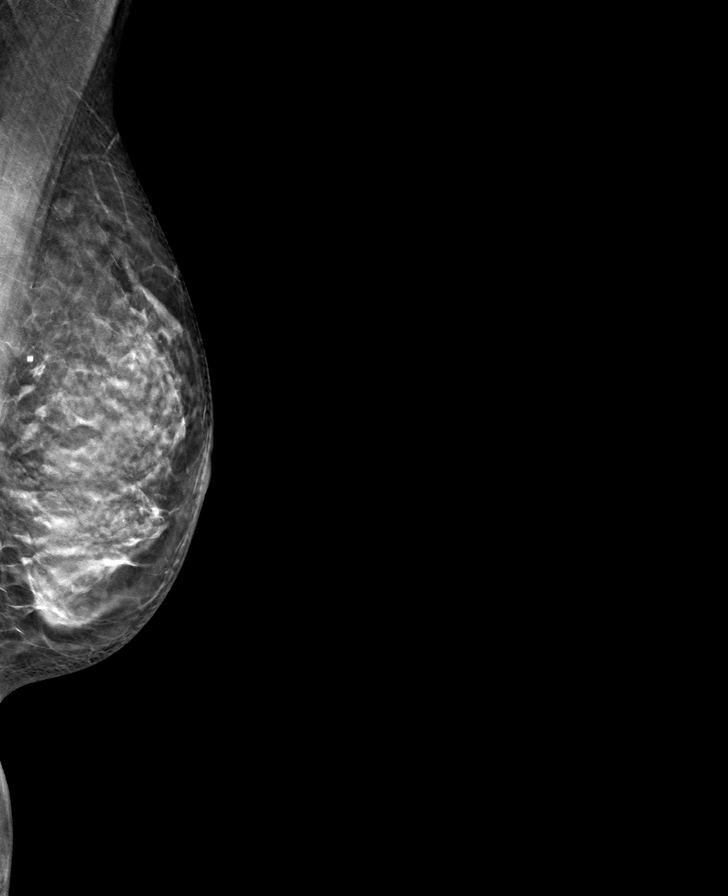

[R MLO tomo · tomo slice 26/51.0]
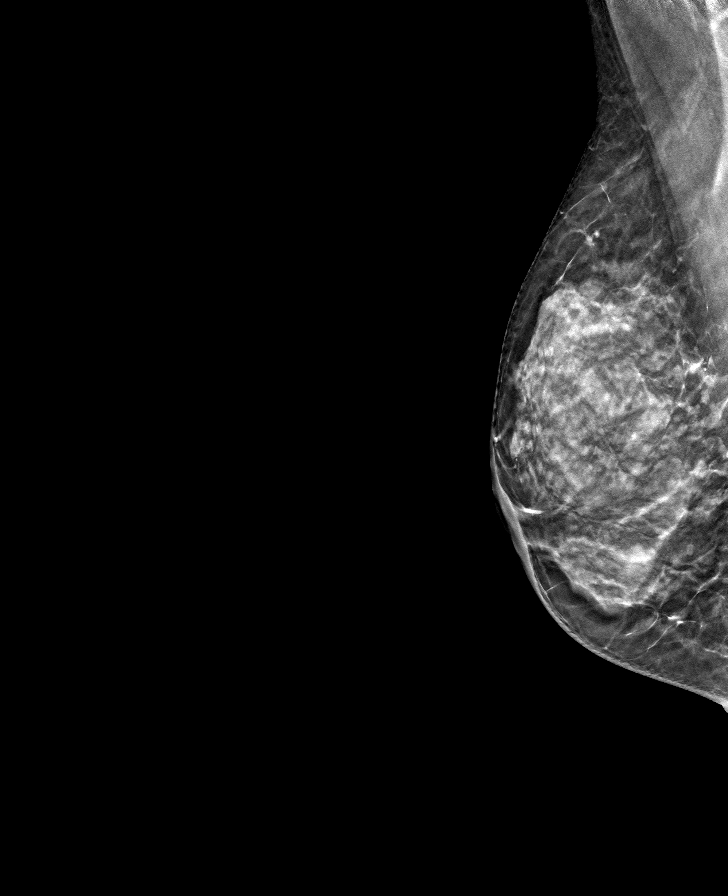

[L CC tomo · tomo slice 28/55.0]
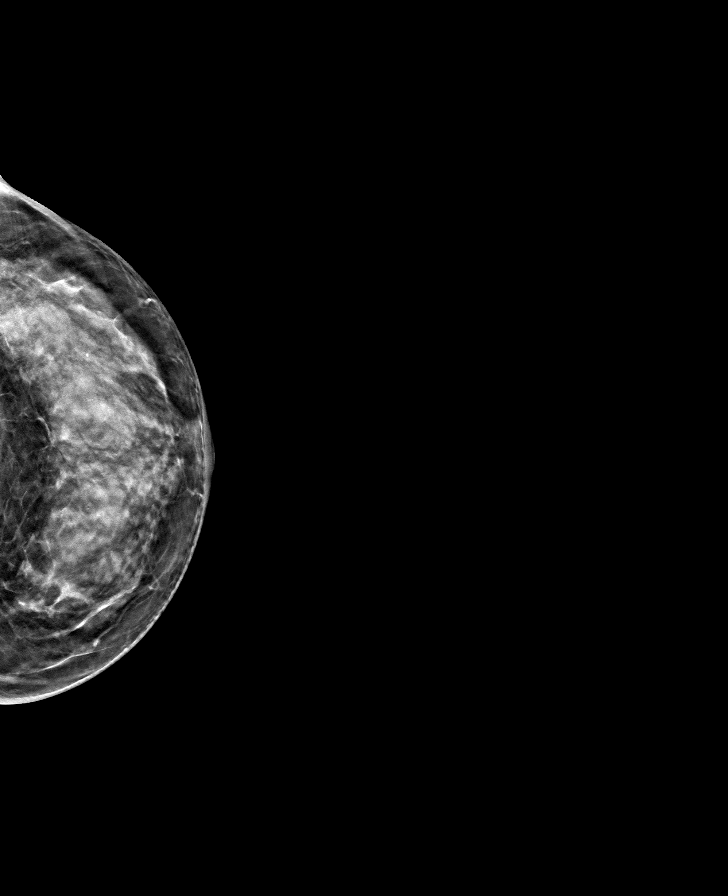

[R CC tomo · tomo slice 25/48.0]
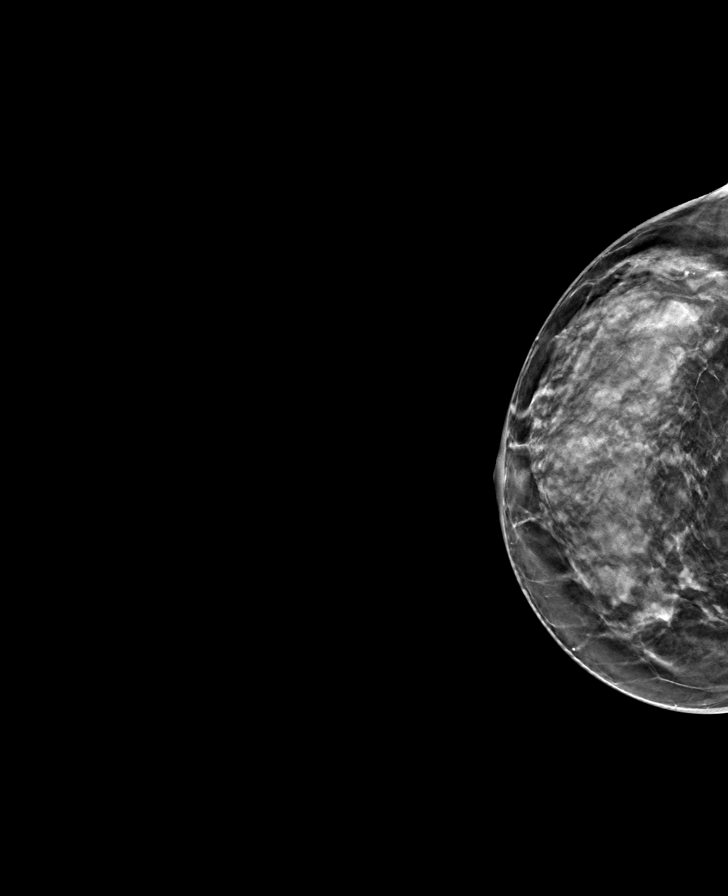

[8 of 24 positions shown; findings below may reference images not displayed]

ACR Breast Density Category d: The breast tissue is extremely dense,
which lowers the sensitivity of mammography
FINDINGS: There are no findings suspicious for malignancy. Images were
processed with CAD.
IMPRESSION: No mammographic evidence of malignancy. A result letter of this
screening mammogram will be mailed directly to the patient.

RECOMMENDATION:
Screening mammogram in one year. (Code:WO-0-ZI0)

BI-RADS CATEGORY  1: Negative.

## 2021-08-18 ENCOUNTER — Inpatient Hospital Stay: Payer: Medicare HMO | Attending: Oncology

## 2021-08-18 ENCOUNTER — Other Ambulatory Visit: Payer: Self-pay

## 2021-08-18 ENCOUNTER — Encounter: Payer: Self-pay | Admitting: Oncology

## 2021-08-18 ENCOUNTER — Inpatient Hospital Stay: Payer: Medicare HMO | Admitting: Oncology

## 2021-08-18 VITALS — BP 124/73 | HR 77 | Temp 97.0°F | Resp 18 | Wt 142.9 lb

## 2021-08-18 DIAGNOSIS — D72820 Lymphocytosis (symptomatic): Secondary | ICD-10-CM

## 2021-08-18 DIAGNOSIS — Z79899 Other long term (current) drug therapy: Secondary | ICD-10-CM | POA: Insufficient documentation

## 2021-08-18 DIAGNOSIS — Z87891 Personal history of nicotine dependence: Secondary | ICD-10-CM | POA: Diagnosis not present

## 2021-08-18 DIAGNOSIS — C9110 Chronic lymphocytic leukemia of B-cell type not having achieved remission: Secondary | ICD-10-CM | POA: Diagnosis not present

## 2021-08-18 DIAGNOSIS — F17210 Nicotine dependence, cigarettes, uncomplicated: Secondary | ICD-10-CM | POA: Diagnosis not present

## 2021-08-18 DIAGNOSIS — Z7982 Long term (current) use of aspirin: Secondary | ICD-10-CM | POA: Insufficient documentation

## 2021-08-18 LAB — CBC WITH DIFFERENTIAL/PLATELET
Abs Immature Granulocytes: 0.02 10*3/uL (ref 0.00–0.07)
Basophils Absolute: 0.1 10*3/uL (ref 0.0–0.1)
Basophils Relative: 1 %
Eosinophils Absolute: 0.4 10*3/uL (ref 0.0–0.5)
Eosinophils Relative: 3 %
HCT: 41.7 % (ref 36.0–46.0)
Hemoglobin: 14 g/dL (ref 12.0–15.0)
Immature Granulocytes: 0 %
Lymphocytes Relative: 68 %
Lymphs Abs: 10.4 10*3/uL — ABNORMAL HIGH (ref 0.7–4.0)
MCH: 32.3 pg (ref 26.0–34.0)
MCHC: 33.6 g/dL (ref 30.0–36.0)
MCV: 96.1 fL (ref 80.0–100.0)
Monocytes Absolute: 0.7 10*3/uL (ref 0.1–1.0)
Monocytes Relative: 4 %
Neutro Abs: 3.6 10*3/uL (ref 1.7–7.7)
Neutrophils Relative %: 24 %
Platelets: 238 10*3/uL (ref 150–400)
RBC: 4.34 MIL/uL (ref 3.87–5.11)
RDW: 13.2 % (ref 11.5–15.5)
WBC: 15.2 10*3/uL — ABNORMAL HIGH (ref 4.0–10.5)
nRBC: 0 % (ref 0.0–0.2)

## 2021-08-18 LAB — COMPREHENSIVE METABOLIC PANEL
ALT: 21 U/L (ref 0–44)
AST: 28 U/L (ref 15–41)
Albumin: 4.4 g/dL (ref 3.5–5.0)
Alkaline Phosphatase: 54 U/L (ref 38–126)
Anion gap: 5 (ref 5–15)
BUN: 15 mg/dL (ref 8–23)
CO2: 29 mmol/L (ref 22–32)
Calcium: 9.3 mg/dL (ref 8.9–10.3)
Chloride: 103 mmol/L (ref 98–111)
Creatinine, Ser: 0.8 mg/dL (ref 0.44–1.00)
GFR, Estimated: >60 mL/min (ref >60)
Glucose, Bld: 122 mg/dL — ABNORMAL HIGH (ref 70–99)
Potassium: 4.7 mmol/L (ref 3.5–5.1)
Sodium: 137 mmol/L (ref 135–145)
Total Bilirubin: 0.1 mg/dL — ABNORMAL LOW (ref 0.3–1.2)
Total Protein: 7 g/dL (ref 6.5–8.1)

## 2021-08-18 LAB — LACTATE DEHYDROGENASE: LDH: 159 U/L (ref 98–192)

## 2021-08-18 NOTE — Progress Notes (Signed)
Hematology/Oncology Progress note [phone] [phone]      Clinic Day:  08/18/2021  Referring physician: Adin Hector, MD  Chief Complaint: Melissa Castro is a 66 y.o. 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  INTERVAL HISTORY Patient previously followed up by Dr.Corcoran, patient switched care to me on 02/04/21 Patient reports feeling well.  Denies any unintentional weight loss, night sweats, fever. Current everyday smoker.   Past Medical History:  Diagnosis Date   Thyroid disease     History reviewed. No pertinent surgical history.  Family History  Problem Relation Age of Onset   Breast cancer Neg Hx     Social History:  reports that she has been smoking. She has a 64.50 pack-year smoking history. She has never used smokeless tobacco. No history on file for alcohol use and drug use. She has smoked 1 1/2 packs/day x 40 years.  Allergies: No Known Allergies  Current Medications: Current Outpatient Medications  Medication Sig Dispense Refill   aspirin 81 MG chewable tablet Chew 81 mg by mouth daily.     Calcium Carbonate-Vitamin D (CALCIUM-VITAMIN D) 600-125 MG-UNIT TABS Take by mouth daily.     Cholecalciferol (VITAMIN D3) 5000 units TABS Take by mouth daily.      Ibuprofen-Diphenhydramine Cit (IBUPROFEN PM PO) Take by mouth at bedtime as needed.     levothyroxine (SYNTHROID, LEVOTHROID) 125 MCG tablet Take 125 mcg by mouth daily before breakfast.      simvastatin (ZOCOR) 20 MG tablet Take 20 mg by mouth daily at 6 PM.      No current facility-administered medications for this visit.    Review of Systems  Constitutional:  Negative for chills, diaphoresis, fever, malaise/fatigue and weight loss.  HENT: Negative.  Negative for congestion, ear discharge, ear pain, hearing loss, nosebleeds, sinus pain, sore throat and tinnitus.   Eyes: Negative.  Negative for blurred vision.  Respiratory: Negative.   Negative for cough, hemoptysis, sputum production and shortness of breath.   Cardiovascular: Negative.  Negative for chest pain, palpitations, orthopnea, leg swelling and PND.  Gastrointestinal: Negative.  Negative for abdominal pain, blood in stool, constipation, diarrhea, heartburn, melena, nausea and vomiting.  Genitourinary: Negative.  Negative for dysuria, frequency, hematuria and urgency.  Musculoskeletal: Negative.  Negative for back pain, joint pain, myalgias and neck pain.  Skin: Negative.  Negative for rash.  Neurological: Negative.  Negative for dizziness, tingling, sensory change, weakness and headaches.  Endo/Heme/Allergies: Negative.  Does not bruise/bleed easily.       Thyroid disease on Synthroid.  Psychiatric/Behavioral: Negative.  Negative for depression and memory loss. The patient is not nervous/anxious and does not have insomnia.   All other systems reviewed and are negative. Performance status (ECOG): 0  Vitals Blood pressure 124/73, pulse 77, temperature (!) 97 F (36.1 C), resp. rate 18, weight 142 lb 14.4 oz (64.8 kg).   Physical Exam HENT:     Head: Normocephalic.  Eyes:     General: No scleral icterus. Cardiovascular:     Rate and Rhythm: Normal rate.  Pulmonary:     Effort: Pulmonary effort is normal. No respiratory distress.  Musculoskeletal:        General: No swelling. Normal range of motion.     Cervical back: Normal range of motion.  Skin:    General: Skin is warm.  Neurological:     General: No focal deficit present.     Mental Status: She is alert and  oriented to person, place, and time.  Psychiatric:        Mood and Affect: Mood normal.   Labs are reviewed and discussed with patient. CBC    Component Value Date/Time   WBC 15.2 (H) 08/18/2021 1359   RBC 4.34 08/18/2021 1359   HGB 14.0 08/18/2021 1359   HCT 41.7 08/18/2021 1359   PLT 238 08/18/2021 1359   MCV 96.1 08/18/2021 1359   MCH 32.3 08/18/2021 1359   MCHC 33.6 08/18/2021 1359    RDW 13.2 08/18/2021 1359   LYMPHSABS 10.4 (H) 08/18/2021 1359   MONOABS 0.7 08/18/2021 1359   EOSABS 0.4 08/18/2021 1359   BASOSABS 0.1 08/18/2021 1359   CMP Latest Ref Rng & Units 08/18/2021 02/06/2019 08/09/2018  Glucose 70 - 99 mg/dL 122(H) 110(H) 106(H)  BUN 8 - 23 mg/dL 15 11 13   Creatinine 0.44 - 1.00 mg/dL 0.80 0.75 0.73  Sodium 135 - 145 mmol/L 137 138 136  Potassium 3.5 - 5.1 mmol/L 4.7 4.5 4.3  Chloride 98 - 111 mmol/L 103 101 101  CO2 22 - 32 mmol/L 29 26 27   Calcium 8.9 - 10.3 mg/dL 9.3 9.6 9.0  Total Protein 6.5 - 8.1 g/dL 7.0 7.0 7.1  Total Bilirubin 0.3 - 1.2 mg/dL 0.1(L) 0.5 0.6  Alkaline Phos 38 - 126 U/L 54 55 56  AST 15 - 41 U/L 28 29 27   ALT 0 - 44 U/L 21 19 22      Assessment and Plan 1. CLL (chronic lymphocytic leukemia) ([place])   2. Personal history of tobacco use, presenting hazards to health    #CLL Labs are reviewed and discussed with patient.  Overall stable leukocytosis with lymphocytosis. She has no constitutional symptoms. Continue watchful waiting.  Check CLL FISH, IGHV mutation status.   #Tobacco use, continue annual lung cancer screening.  Recommend smoke cessation. Continue annual lung cancer screening.  RTC in 1 year for MD assessment and review of labs.  I discussed the assessment and treatment plan with the patient.  The patient was provided an opportunity to ask questions and all were answered.  The patient agreed with the plan and demonstrated an understanding of the instructions.  The patient was advised to call back if the symptoms worsen or if the condition fails to improve as anticipated.   Earlie Server, MD, PhD Hematology Oncology  08/18/2021

## 2021-08-18 NOTE — Progress Notes (Signed)
Pt here for follow up. No new concerns voiced.   

## 2021-08-19 ENCOUNTER — Other Ambulatory Visit: Payer: BC Managed Care – PPO

## 2021-08-19 ENCOUNTER — Ambulatory Visit: Payer: BC Managed Care – PPO | Admitting: Oncology

## 2021-08-25 LAB — FISH HES LEUKEMIA, 4Q12 REA

## 2021-08-25 LAB — MISC LABCORP TEST (SEND OUT): Labcorp test code: 113753

## 2022-02-10 ENCOUNTER — Ambulatory Visit: Payer: BC Managed Care – PPO | Admitting: Oncology

## 2022-02-10 ENCOUNTER — Other Ambulatory Visit: Payer: BC Managed Care – PPO

## 2022-03-25 ENCOUNTER — Other Ambulatory Visit: Payer: Self-pay | Admitting: Internal Medicine

## 2022-03-25 DIAGNOSIS — Z1231 Encounter for screening mammogram for malignant neoplasm of breast: Secondary | ICD-10-CM

## 2022-04-14 ENCOUNTER — Ambulatory Visit
Admission: RE | Admit: 2022-04-14 | Discharge: 2022-04-14 | Disposition: A | Discharge status: Home / Self Care | Payer: Medicare HMO | Source: Ambulatory Visit | Attending: Internal Medicine | Admitting: Internal Medicine

## 2022-04-14 DIAGNOSIS — Z1231 Encounter for screening mammogram for malignant neoplasm of breast: Secondary | ICD-10-CM | POA: Diagnosis present

## 2022-06-15 ENCOUNTER — Ambulatory Visit
Admission: RE | Admit: 2022-06-15 | Discharge: 2022-06-15 | Disposition: A | Discharge status: Home / Self Care | Payer: Medicare HMO | Source: Ambulatory Visit | Attending: Internal Medicine | Admitting: Internal Medicine

## 2022-06-15 DIAGNOSIS — F17210 Nicotine dependence, cigarettes, uncomplicated: Secondary | ICD-10-CM

## 2022-06-15 DIAGNOSIS — Z87891 Personal history of nicotine dependence: Secondary | ICD-10-CM | POA: Diagnosis present

## 2022-06-17 ENCOUNTER — Other Ambulatory Visit: Payer: Self-pay

## 2022-06-17 DIAGNOSIS — Z87891 Personal history of nicotine dependence: Secondary | ICD-10-CM

## 2022-06-17 DIAGNOSIS — F17210 Nicotine dependence, cigarettes, uncomplicated: Secondary | ICD-10-CM

## 2022-08-18 ENCOUNTER — Encounter: Payer: Self-pay | Admitting: Oncology

## 2022-08-18 ENCOUNTER — Inpatient Hospital Stay: Payer: Medicare HMO | Attending: Oncology

## 2022-08-18 ENCOUNTER — Inpatient Hospital Stay (HOSPITAL_BASED_OUTPATIENT_CLINIC_OR_DEPARTMENT_OTHER): Payer: Medicare HMO | Admitting: Oncology

## 2022-08-18 VITALS — BP 119/65 | HR 90 | Temp 96.5°F | Wt 136.0 lb

## 2022-08-18 DIAGNOSIS — F17210 Nicotine dependence, cigarettes, uncomplicated: Secondary | ICD-10-CM | POA: Insufficient documentation

## 2022-08-18 DIAGNOSIS — Z87891 Personal history of nicotine dependence: Secondary | ICD-10-CM | POA: Diagnosis not present

## 2022-08-18 DIAGNOSIS — C9110 Chronic lymphocytic leukemia of B-cell type not having achieved remission: Secondary | ICD-10-CM | POA: Diagnosis present

## 2022-08-18 DIAGNOSIS — Z7982 Long term (current) use of aspirin: Secondary | ICD-10-CM | POA: Insufficient documentation

## 2022-08-18 DIAGNOSIS — Z79899 Other long term (current) drug therapy: Secondary | ICD-10-CM | POA: Insufficient documentation

## 2022-08-18 LAB — COMPREHENSIVE METABOLIC PANEL
ALT: 19 U/L (ref 0–44)
AST: 29 U/L (ref 15–41)
Albumin: 4.5 g/dL (ref 3.5–5.0)
Alkaline Phosphatase: 48 U/L (ref 38–126)
Anion gap: 10 (ref 5–15)
BUN: 13 mg/dL (ref 8–23)
CO2: 25 mmol/L (ref 22–32)
Calcium: 9.4 mg/dL (ref 8.9–10.3)
Chloride: 102 mmol/L (ref 98–111)
Creatinine, Ser: 0.75 mg/dL (ref 0.44–1.00)
GFR, Estimated: >60 mL/min (ref >60)
Glucose, Bld: 115 mg/dL — ABNORMAL HIGH (ref 70–99)
Potassium: 4.4 mmol/L (ref 3.5–5.1)
Sodium: 137 mmol/L (ref 135–145)
Total Bilirubin: 0.5 mg/dL (ref 0.3–1.2)
Total Protein: 7 g/dL (ref 6.5–8.1)

## 2022-08-18 LAB — CBC WITH DIFFERENTIAL/PLATELET
Abs Immature Granulocytes: 0.03 10*3/uL (ref 0.00–0.07)
Basophils Absolute: 0.1 10*3/uL (ref 0.0–0.1)
Basophils Relative: 1 %
Eosinophils Absolute: 0.3 10*3/uL (ref 0.0–0.5)
Eosinophils Relative: 2 %
HCT: 43.5 % (ref 36.0–46.0)
Hemoglobin: 14.5 g/dL (ref 12.0–15.0)
Immature Granulocytes: 0 %
Lymphocytes Relative: 69 %
Lymphs Abs: 14 10*3/uL — ABNORMAL HIGH (ref 0.7–4.0)
MCH: 31.9 pg (ref 26.0–34.0)
MCHC: 33.3 g/dL (ref 30.0–36.0)
MCV: 95.8 fL (ref 80.0–100.0)
Monocytes Absolute: 0.9 10*3/uL (ref 0.1–1.0)
Monocytes Relative: 5 %
Neutro Abs: 4.7 10*3/uL (ref 1.7–7.7)
Neutrophils Relative %: 23 %
Platelets: 271 10*3/uL (ref 150–400)
RBC: 4.54 MIL/uL (ref 3.87–5.11)
RDW: 13.1 % (ref 11.5–15.5)
WBC: 20 10*3/uL — ABNORMAL HIGH (ref 4.0–10.5)
nRBC: 0 % (ref 0.0–0.2)

## 2022-08-18 LAB — LACTATE DEHYDROGENASE: LDH: 150 U/L (ref 98–192)

## 2022-08-18 NOTE — Assessment & Plan Note (Signed)
Recommend smoking cessation.  Continue annual lung cancer screening.

## 2022-08-18 NOTE — Assessment & Plan Note (Signed)
CLL, IgVH Somatic Hypermutation detected. Labs reviewed and discussed with patient.  White count has increased. Patient has no significant constitutional symptoms.  She does have some weight loss which was felt to be secondary to grief. Recommend continue watchful waiting.  Follow-up in 6 months.

## 2022-08-18 NOTE — Progress Notes (Signed)
Hematology/Oncology Progress note [phone] [phone]    Chief Complaint: CLL  ASSESSMENT & PLAN:   Cancer Staging  CLL (chronic lymphocytic leukemia) ([place]) Staging form: Chronic Lymphocytic Leukemia / Small Lymphocytic Lymphoma, AJCC 8th Edition - Clinical stage from 08/18/2022: Modified Rai Stage 0 (Modified Rai risk: Low, Lymphocytosis: Present, Adenopathy: Absent, Organomegaly: Absent, Anemia: Absent, Thrombocytopenia: Absent) - Signed by Earlie Server, MD on 08/18/2022   CLL (chronic lymphocytic leukemia) ([place]) CLL, IgVH Somatic Hypermutation detected. Labs reviewed and discussed with patient.  White count has increased. Patient has no significant constitutional symptoms.  She does have some weight loss which was felt to be secondary to grief. Recommend continue watchful waiting.  Follow-up in 6 months.  Personal history of tobacco use, presenting hazards to health Recommend smoking cessation.  Continue annual lung cancer screening.  Orders Placed This Encounter  Procedures   CBC with Differential ([place] Only)    Standing Status:   Future    Standing Expiration Date:   05/19/2023   CMP ([place] only)    Standing Status:   Future    Standing Expiration Date:   05/19/2023   Lactate dehydrogenase    Standing Status:   Future    Standing Expiration Date:   05/19/2023   Follow-up in 6 months. All questions were answered. The patient knows to call the clinic with any problems, questions or concerns.  Earlie Server, MD, PhD [facility] Hematology Oncology 08/18/2022   INTERVAL HISTORY Patient previously followed up by Dr.Corcoran, patient switched care to me on 02/04/21 Patient reports feeling well.  Denies any, night sweats, fever. Since last visit, she has lost 6 pounds.  She reports that last year has been stressful,.  Her sister passed away Current everyday smoker.   Past Medical History:  Diagnosis Date   Thyroid disease     History  reviewed. No pertinent surgical history.  Family History  Problem Relation Age of Onset   Breast cancer Neg Hx     Social History:  reports that she has been smoking cigarettes. She has a 64.50 pack-year smoking history. She has never used smokeless tobacco. No history on file for alcohol use and drug use. She has smoked 1 1/2 packs/day x 40 years.  Allergies: No Known Allergies  Current Medications: Current Outpatient Medications  Medication Sig Dispense Refill   aspirin 81 MG chewable tablet Chew 81 mg by mouth daily.     Calcium Carbonate-Vitamin D (CALCIUM-VITAMIN D) 600-125 MG-UNIT TABS Take by mouth daily.     Cholecalciferol (VITAMIN D3) 5000 units TABS Take by mouth daily.      Ibuprofen-Diphenhydramine Cit (IBUPROFEN PM PO) Take by mouth at bedtime as needed.     levothyroxine (SYNTHROID, LEVOTHROID) 125 MCG tablet Take 125 mcg by mouth daily before breakfast.      simvastatin (ZOCOR) 20 MG tablet Take 20 mg by mouth daily at 6 PM.      No current facility-administered medications for this visit.    Review of Systems  Constitutional:  Negative for chills, diaphoresis, fever, malaise/fatigue and weight loss.  HENT: Negative.  Negative for congestion, ear discharge, ear pain, hearing loss, nosebleeds, sinus pain, sore throat and tinnitus.   Eyes: Negative.  Negative for blurred vision.  Respiratory: Negative.  Negative for cough, hemoptysis, sputum production and shortness of breath.   Cardiovascular: Negative.  Negative for chest pain, palpitations, orthopnea, leg swelling and PND.  Gastrointestinal: Negative.  Negative for abdominal pain, blood in stool, constipation,  diarrhea, heartburn, melena, nausea and vomiting.  Genitourinary: Negative.  Negative for dysuria, frequency, hematuria and urgency.  Musculoskeletal: Negative.  Negative for back pain, joint pain, myalgias and neck pain.  Skin: Negative.  Negative for rash.  Neurological: Negative.  Negative for dizziness,  tingling, sensory change, weakness and headaches.  Endo/Heme/Allergies: Negative.  Does not bruise/bleed easily.       Thyroid disease on Synthroid.  Psychiatric/Behavioral: Negative.  Negative for depression and memory loss. The patient is not nervous/anxious and does not have insomnia.   All other systems reviewed and are negative.  Performance status (ECOG): 0  Vitals Blood pressure 119/65, pulse 90, temperature (!) 96.5 F (35.8 C), temperature source Tympanic, weight 136 lb (61.7 kg), SpO2 98 %.   Physical Exam HENT:     Head: Normocephalic.  Eyes:     General: No scleral icterus. Cardiovascular:     Rate and Rhythm: Normal rate.  Pulmonary:     Effort: Pulmonary effort is normal. No respiratory distress.  Musculoskeletal:        General: No swelling. Normal range of motion.     Cervical back: Normal range of motion.  Skin:    General: Skin is warm.  Neurological:     General: No focal deficit present.     Mental Status: She is alert and oriented to person, place, and time.  Psychiatric:        Mood and Affect: Mood normal.    Labs are reviewed and discussed with patient. CBC    Component Value Date/Time   WBC 20.0 (H) 08/18/2022 1252   RBC 4.54 08/18/2022 1252   HGB 14.5 08/18/2022 1252   HCT 43.5 08/18/2022 1252   PLT 271 08/18/2022 1252   MCV 95.8 08/18/2022 1252   MCH 31.9 08/18/2022 1252   MCHC 33.3 08/18/2022 1252   RDW 13.1 08/18/2022 1252   LYMPHSABS 14.0 (H) 08/18/2022 1252   MONOABS 0.9 08/18/2022 1252   EOSABS 0.3 08/18/2022 1252   BASOSABS 0.1 08/18/2022 1252      Latest Ref Rng & Units 08/18/2022   12:52 PM 08/18/2021    1:59 PM 02/06/2019    1:09 PM  CMP  Glucose 70 - 99 mg/dL 115  122  110   BUN 8 - 23 mg/dL 13  15  11   $ Creatinine 0.44 - 1.00 mg/dL 0.75  0.80  0.75   Sodium 135 - 145 mmol/L 137  137  138   Potassium 3.5 - 5.1 mmol/L 4.4  4.7  4.5   Chloride 98 - 111 mmol/L 102  103  101   CO2 22 - 32 mmol/L 25  29  26   $ Calcium 8.9 -  10.3 mg/dL 9.4  9.3  9.6   Total Protein 6.5 - 8.1 g/dL 7.0  7.0  7.0   Total Bilirubin 0.3 - 1.2 mg/dL 0.5  0.1  0.5   Alkaline Phos 38 - 126 U/L 48  54  55   AST 15 - 41 U/L 29  28  29   $ ALT 0 - 44 U/L 19  21  19

## 2023-02-16 ENCOUNTER — Encounter: Payer: Self-pay | Admitting: Oncology

## 2023-02-16 ENCOUNTER — Inpatient Hospital Stay: Payer: Medicare HMO | Attending: Oncology

## 2023-02-16 ENCOUNTER — Inpatient Hospital Stay (HOSPITAL_BASED_OUTPATIENT_CLINIC_OR_DEPARTMENT_OTHER): Payer: Medicare HMO | Admitting: Oncology

## 2023-02-16 VITALS — BP 118/67 | HR 81 | Temp 96.9°F | Resp 18 | Wt 138.2 lb

## 2023-02-16 DIAGNOSIS — Z79899 Other long term (current) drug therapy: Secondary | ICD-10-CM | POA: Diagnosis not present

## 2023-02-16 DIAGNOSIS — C9110 Chronic lymphocytic leukemia of B-cell type not having achieved remission: Secondary | ICD-10-CM | POA: Insufficient documentation

## 2023-02-16 DIAGNOSIS — Z7982 Long term (current) use of aspirin: Secondary | ICD-10-CM | POA: Diagnosis not present

## 2023-02-16 DIAGNOSIS — Z87891 Personal history of nicotine dependence: Secondary | ICD-10-CM | POA: Diagnosis not present

## 2023-02-16 DIAGNOSIS — F17210 Nicotine dependence, cigarettes, uncomplicated: Secondary | ICD-10-CM | POA: Diagnosis not present

## 2023-02-16 LAB — CBC WITH DIFFERENTIAL (CANCER CENTER ONLY)
Abs Immature Granulocytes: 0.03 10*3/uL (ref 0.00–0.07)
Basophils Absolute: 0.1 10*3/uL (ref 0.0–0.1)
Basophils Relative: 1 %
Eosinophils Absolute: 0.3 10*3/uL (ref 0.0–0.5)
Eosinophils Relative: 2 %
HCT: 41.5 % (ref 36.0–46.0)
Hemoglobin: 13.9 g/dL (ref 12.0–15.0)
Immature Granulocytes: 0 %
Lymphocytes Relative: 69 %
Lymphs Abs: 11.7 10*3/uL — ABNORMAL HIGH (ref 0.7–4.0)
MCH: 32.2 pg (ref 26.0–34.0)
MCHC: 33.5 g/dL (ref 30.0–36.0)
MCV: 96.1 fL (ref 80.0–100.0)
Monocytes Absolute: 0.8 10*3/uL (ref 0.1–1.0)
Monocytes Relative: 5 %
Neutro Abs: 3.8 10*3/uL (ref 1.7–7.7)
Neutrophils Relative %: 23 %
Platelet Count: 225 10*3/uL (ref 150–400)
RBC: 4.32 MIL/uL (ref 3.87–5.11)
RDW: 13.1 % (ref 11.5–15.5)
WBC Count: 16.7 10*3/uL — ABNORMAL HIGH (ref 4.0–10.5)
nRBC: 0 % (ref 0.0–0.2)

## 2023-02-16 LAB — CMP (CANCER CENTER ONLY)
ALT: 20 U/L (ref 0–44)
AST: 31 U/L (ref 15–41)
Albumin: 4.5 g/dL (ref 3.5–5.0)
Alkaline Phosphatase: 52 U/L (ref 38–126)
Anion gap: 7 (ref 5–15)
BUN: 14 mg/dL (ref 8–23)
CO2: 26 mmol/L (ref 22–32)
Calcium: 9.3 mg/dL (ref 8.9–10.3)
Chloride: 106 mmol/L (ref 98–111)
Creatinine: 0.71 mg/dL (ref 0.44–1.00)
GFR, Estimated: >60 mL/min (ref >60)
Glucose, Bld: 109 mg/dL — ABNORMAL HIGH (ref 70–99)
Potassium: 4.6 mmol/L (ref 3.5–5.1)
Sodium: 139 mmol/L (ref 135–145)
Total Bilirubin: 0.3 mg/dL (ref 0.3–1.2)
Total Protein: 6.9 g/dL (ref 6.5–8.1)

## 2023-02-16 LAB — LACTATE DEHYDROGENASE: LDH: 160 U/L (ref 98–192)

## 2023-02-16 NOTE — Assessment & Plan Note (Signed)
Recommend smoking cessation.  Continue annual lung cancer screening.

## 2023-02-16 NOTE — Progress Notes (Signed)
Pt here for follow up. No new concerns voiced.   

## 2023-02-16 NOTE — Progress Notes (Signed)
Hematology/Oncology Progress note Telephone:(336) [phone] Fax:(336) [phone]    Chief Complaint: CLL  ASSESSMENT & PLAN:   Cancer Staging  CLL (chronic lymphocytic leukemia) ([facility]) Staging form: Chronic Lymphocytic Leukemia / Small Lymphocytic Lymphoma, AJCC 8th Edition - Clinical stage from 08/18/2022: Modified Rai Stage 0 (Modified Rai risk: Low, Lymphocytosis: Present, Adenopathy: Absent, Organomegaly: Absent, Anemia: Absent, Thrombocytopenia: Absent) - Signed by Rickard Patience, MD on 08/18/2022   CLL (chronic lymphocytic leukemia) ([facility]) CLL, IgVH Somatic Hypermutation detected. Labs reviewed and discussed with patient.  White count has increased. Patient has no significant constitutional symptoms.  She does have some weight loss which was felt to be secondary to grief. Recommend continue watchful waiting.  Follow-up in 6 months.  Personal history of tobacco use, presenting hazards to health Recommend smoking cessation.  Continue annual lung cancer screening.  Orders Placed This Encounter  Procedures   CMP ([facility] only)    Standing Status:   Future    Standing Expiration Date:   02/16/2024   CBC with Differential ([facility] Only)    Standing Status:   Future    Standing Expiration Date:   02/16/2024   Follow-up in 6 months. All questions were answered. The patient knows to call the clinic with any problems, questions or concerns.  Rickard Patience, MD, PhD [facility] Hematology Oncology 02/16/2023   INTERVAL HISTORY Patient previously followed up by Dr.Corcoran, patient switched care to me on 02/04/21 Patient reports feeling well.  Denies any, night sweats, fever, weight loss.  Current everyday smoker.    Past Medical History:  Diagnosis Date   Thyroid disease     History reviewed. No pertinent surgical history.  Family History  Problem Relation Age of Onset   Breast cancer Neg Hx     Social History:  reports that she has been smoking cigarettes. She has a 64.5  pack-year smoking history. She has never used smokeless tobacco. No history on file for alcohol use and drug use. She has smoked 1 1/2 packs/day x 40 years.  Allergies: No Known Allergies  Current Medications: Current Outpatient Medications  Medication Sig Dispense Refill   aspirin 81 MG chewable tablet Chew 81 mg by mouth daily.     Calcium Carbonate-Vitamin D (CALCIUM-VITAMIN D) 600-125 MG-UNIT TABS Take by mouth daily.     Cholecalciferol (VITAMIN D3) 5000 units TABS Take 1 tablet by mouth daily.     Ibuprofen-Diphenhydramine Cit (IBUPROFEN PM PO) Take by mouth at bedtime as needed.     levothyroxine (SYNTHROID, LEVOTHROID) 125 MCG tablet Take 125 mcg by mouth daily before breakfast.      Multiple Vitamin (MULTIVITAMIN ADULT PO) Take 1 tablet by mouth daily.     simvastatin (ZOCOR) 40 MG tablet Take 1 tablet by mouth at bedtime.     No current facility-administered medications for this visit.    Review of Systems  Constitutional:  Negative for chills, diaphoresis, fever, malaise/fatigue and weight loss.  HENT: Negative.  Negative for congestion, ear discharge, ear pain, hearing loss, nosebleeds, sinus pain, sore throat and tinnitus.   Eyes: Negative.  Negative for blurred vision.  Respiratory: Negative.  Negative for cough, hemoptysis, sputum production and shortness of breath.   Cardiovascular: Negative.  Negative for chest pain, palpitations, orthopnea, leg swelling and PND.  Gastrointestinal: Negative.  Negative for abdominal pain, blood in stool, constipation, diarrhea, heartburn, melena, nausea and vomiting.  Genitourinary: Negative.  Negative for dysuria, frequency, hematuria and urgency.  Musculoskeletal: Negative.  Negative for back pain, joint  pain, myalgias and neck pain.  Skin: Negative.  Negative for rash.  Neurological: Negative.  Negative for dizziness, tingling, sensory change, weakness and headaches.  Endo/Heme/Allergies: Negative.  Does not bruise/bleed easily.        Thyroid disease on Synthroid.  Psychiatric/Behavioral: Negative.  Negative for depression and memory loss. The patient is not nervous/anxious and does not have insomnia.   All other systems reviewed and are negative.  Performance status (ECOG): 0  Vitals Blood pressure 118/67, pulse 81, temperature (!) 96.9 F (36.1 C), resp. rate 18, weight 138 lb 3.2 oz (62.7 kg), SpO2 99%.   Physical Exam HENT:     Head: Normocephalic.  Eyes:     General: No scleral icterus. Cardiovascular:     Rate and Rhythm: Normal rate.  Pulmonary:     Effort: Pulmonary effort is normal. No respiratory distress.  Abdominal:     General: There is no distension.     Palpations: Abdomen is soft. There is no mass.     Tenderness: There is no abdominal tenderness.  Musculoskeletal:        General: No swelling. Normal range of motion.     Cervical back: Normal range of motion.  Lymphadenopathy:     Cervical: No cervical adenopathy.  Skin:    General: Skin is warm.  Neurological:     General: No focal deficit present.     Mental Status: She is alert and oriented to person, place, and time.  Psychiatric:        Mood and Affect: Mood normal.    Labs are reviewed and discussed with patient. CBC    Component Value Date/Time   WBC 16.7 (H) 02/16/2023 1312   WBC 20.0 (H) 08/18/2022 1252   RBC 4.32 02/16/2023 1312   HGB 13.9 02/16/2023 1312   HCT 41.5 02/16/2023 1312   PLT 225 02/16/2023 1312   MCV 96.1 02/16/2023 1312   MCH 32.2 02/16/2023 1312   MCHC 33.5 02/16/2023 1312   RDW 13.1 02/16/2023 1312   LYMPHSABS 11.7 (H) 02/16/2023 1312   MONOABS 0.8 02/16/2023 1312   EOSABS 0.3 02/16/2023 1312   BASOSABS 0.1 02/16/2023 1312      Latest Ref Rng & Units 02/16/2023    1:13 PM 08/18/2022   12:52 PM 08/18/2021    1:59 PM  CMP  Glucose 70 - 99 mg/dL 315  176  160   BUN 8 - 23 mg/dL 14  13  15    Creatinine 0.44 - 1.00 mg/dL 7.37  1.06  2.69   Sodium 135 - 145 mmol/L 139  137  137   Potassium 3.5 - 5.1  mmol/L 4.6  4.4  4.7   Chloride 98 - 111 mmol/L 106  102  103   CO2 22 - 32 mmol/L 26  25  29    Calcium 8.9 - 10.3 mg/dL 9.3  9.4  9.3   Total Protein 6.5 - 8.1 g/dL 6.9  7.0  7.0   Total Bilirubin 0.3 - 1.2 mg/dL 0.3  0.5  0.1   Alkaline Phos 38 - 126 U/L 52  48  54   AST 15 - 41 U/L 31  29  28    ALT 0 - 44 U/L 20  19  21

## 2023-02-16 NOTE — Assessment & Plan Note (Addendum)
CLL, IgVH Somatic Hypermutation detected. Labs reviewed and discussed with patient.  White count has increased. Patient has no significant constitutional symptoms.  She does have some weight loss which was felt to be secondary to grief. Recommend continue watchful waiting.  Follow-up in 6 months.

## 2023-04-28 ENCOUNTER — Other Ambulatory Visit: Payer: Self-pay | Admitting: Internal Medicine

## 2023-04-28 DIAGNOSIS — Z1231 Encounter for screening mammogram for malignant neoplasm of breast: Secondary | ICD-10-CM

## 2023-05-10 ENCOUNTER — Ambulatory Visit
Admission: RE | Admit: 2023-05-10 | Discharge: 2023-05-10 | Disposition: A | Discharge status: Home / Self Care | Payer: Medicare HMO | Source: Ambulatory Visit | Attending: Internal Medicine | Admitting: Internal Medicine

## 2023-05-10 DIAGNOSIS — Z1231 Encounter for screening mammogram for malignant neoplasm of breast: Secondary | ICD-10-CM | POA: Insufficient documentation

## 2023-05-16 ENCOUNTER — Other Ambulatory Visit: Payer: Self-pay | Admitting: Acute Care

## 2023-05-16 DIAGNOSIS — Z87891 Personal history of nicotine dependence: Secondary | ICD-10-CM

## 2023-05-16 DIAGNOSIS — F17210 Nicotine dependence, cigarettes, uncomplicated: Secondary | ICD-10-CM

## 2023-06-19 ENCOUNTER — Ambulatory Visit
Admission: RE | Admit: 2023-06-19 | Discharge: 2023-06-19 | Disposition: A | Discharge status: Home / Self Care | Payer: Medicare HMO | Source: Ambulatory Visit | Attending: Acute Care | Admitting: Acute Care

## 2023-06-19 DIAGNOSIS — F17210 Nicotine dependence, cigarettes, uncomplicated: Secondary | ICD-10-CM | POA: Diagnosis present

## 2023-06-19 DIAGNOSIS — Z87891 Personal history of nicotine dependence: Secondary | ICD-10-CM | POA: Diagnosis present

## 2023-06-26 ENCOUNTER — Other Ambulatory Visit: Payer: Self-pay

## 2023-06-26 DIAGNOSIS — F17210 Nicotine dependence, cigarettes, uncomplicated: Secondary | ICD-10-CM

## 2023-06-26 DIAGNOSIS — Z122 Encounter for screening for malignant neoplasm of respiratory organs: Secondary | ICD-10-CM

## 2023-06-26 DIAGNOSIS — Z87891 Personal history of nicotine dependence: Secondary | ICD-10-CM

## 2023-08-17 ENCOUNTER — Encounter: Payer: Self-pay | Admitting: Oncology

## 2023-08-17 ENCOUNTER — Inpatient Hospital Stay: Payer: Medicare HMO | Attending: Oncology

## 2023-08-17 ENCOUNTER — Inpatient Hospital Stay (HOSPITAL_BASED_OUTPATIENT_CLINIC_OR_DEPARTMENT_OTHER): Payer: Medicare HMO | Admitting: Oncology

## 2023-08-17 VITALS — BP 122/68 | HR 85 | Temp 96.1°F | Ht 66.0 in | Wt 138.6 lb

## 2023-08-17 DIAGNOSIS — Z79899 Other long term (current) drug therapy: Secondary | ICD-10-CM | POA: Insufficient documentation

## 2023-08-17 DIAGNOSIS — F17210 Nicotine dependence, cigarettes, uncomplicated: Secondary | ICD-10-CM | POA: Diagnosis not present

## 2023-08-17 DIAGNOSIS — C9110 Chronic lymphocytic leukemia of B-cell type not having achieved remission: Secondary | ICD-10-CM

## 2023-08-17 DIAGNOSIS — Z7982 Long term (current) use of aspirin: Secondary | ICD-10-CM | POA: Insufficient documentation

## 2023-08-17 LAB — CBC WITH DIFFERENTIAL (CANCER CENTER ONLY)
Abs Immature Granulocytes: 0.05 10*3/uL (ref 0.00–0.07)
Basophils Absolute: 0.1 10*3/uL (ref 0.0–0.1)
Basophils Relative: 0 %
Eosinophils Absolute: 0.3 10*3/uL (ref 0.0–0.5)
Eosinophils Relative: 2 %
HCT: 42.2 % (ref 36.0–46.0)
Hemoglobin: 14 g/dL (ref 12.0–15.0)
Immature Granulocytes: 0 %
Lymphocytes Relative: 72 %
Lymphs Abs: 13.1 10*3/uL — ABNORMAL HIGH (ref 0.7–4.0)
MCH: 32 pg (ref 26.0–34.0)
MCHC: 33.2 g/dL (ref 30.0–36.0)
MCV: 96.3 fL (ref 80.0–100.0)
Monocytes Absolute: 0.7 10*3/uL (ref 0.1–1.0)
Monocytes Relative: 4 %
Neutro Abs: 4 10*3/uL (ref 1.7–7.7)
Neutrophils Relative %: 22 %
Platelet Count: 229 10*3/uL (ref 150–400)
RBC: 4.38 MIL/uL (ref 3.87–5.11)
RDW: 13.1 % (ref 11.5–15.5)
WBC Count: 18.1 10*3/uL — ABNORMAL HIGH (ref 4.0–10.5)
nRBC: 0 % (ref 0.0–0.2)

## 2023-08-17 LAB — CMP (CANCER CENTER ONLY)
ALT: 20 U/L (ref 0–44)
AST: 29 U/L (ref 15–41)
Albumin: 4.4 g/dL (ref 3.5–5.0)
Alkaline Phosphatase: 42 U/L (ref 38–126)
Anion gap: 10 (ref 5–15)
BUN: 15 mg/dL (ref 8–23)
CO2: 25 mmol/L (ref 22–32)
Calcium: 9.4 mg/dL (ref 8.9–10.3)
Chloride: 103 mmol/L (ref 98–111)
Creatinine: 0.86 mg/dL (ref 0.44–1.00)
GFR, Estimated: >60 mL/min (ref >60)
Glucose, Bld: 144 mg/dL — ABNORMAL HIGH (ref 70–99)
Potassium: 4.6 mmol/L (ref 3.5–5.1)
Sodium: 138 mmol/L (ref 135–145)
Total Bilirubin: 0.6 mg/dL (ref 0.0–1.2)
Total Protein: 6.8 g/dL (ref 6.5–8.1)

## 2023-08-17 NOTE — Assessment & Plan Note (Addendum)
CLL, IgVH Somatic Hypermutation detected. Labs reviewed and discussed with patient.  White count has increased. No constitutional symptoms Recommend continue watchful waiting.  Follow-up in 6 months.

## 2023-08-17 NOTE — Progress Notes (Signed)
CT lungs/chest 06/19/23.

## 2023-08-17 NOTE — Progress Notes (Signed)
Hematology/Oncology Progress note [phone] Fax:([phone]    Chief Complaint: CLL  ASSESSMENT & PLAN:   Cancer Staging  CLL (chronic lymphocytic leukemia) (HCC) Staging form: Chronic Lymphocytic Leukemia / Small Lymphocytic Lymphoma, AJCC 8th Edition - Clinical stage from 08/18/2022: Modified Rai Stage 0 (Modified Rai risk: Low, Lymphocytosis: Present, Adenopathy: Absent, Organomegaly: Absent, Anemia: Absent, Thrombocytopenia: Absent) - Signed by Rickard Patience, MD on 08/18/2022   CLL (chronic lymphocytic leukemia) (HCC) CLL, IgVH Somatic Hypermutation detected. Labs reviewed and discussed with patient.  White count has increased. No constitutional symptoms Recommend continue watchful waiting.  Follow-up in 6 months.  Orders Placed This Encounter  Procedures   CBC with Differential (Cancer Center Only)    Standing Status:   Future    Expected Date:   02/14/2024    Expiration Date:   08/16/2024   CMP (Cancer Center only)    Standing Status:   Future    Expected Date:   02/14/2024    Expiration Date:   08/16/2024   Follow-up in 6 months. All questions were answered. The patient knows to call the clinic with any problems, questions or concerns.  Rickard Patience, MD, PhD [facility] Hematology Oncology 08/17/2023   INTERVAL HISTORY Patient previously followed up by Dr.Corcoran, patient switched care to me on 02/04/21 Patient reports feeling well.  Denies any, night sweats, fever, weight loss.  Current everyday smoker.    Past Medical History:  Diagnosis Date   Thyroid disease     History reviewed. No pertinent surgical history.  Family History  Problem Relation Age of Onset   Breast cancer Neg Hx     Social History:  reports that she has been smoking cigarettes. She has a 64.5 pack-year smoking history. She has never used smokeless tobacco. No history on file for alcohol use and drug use. She has smoked 1 1/2 packs/day x 40 years.  Allergies: No Known  Allergies  Current Medications: Current Outpatient Medications  Medication Sig Dispense Refill   aspirin 81 MG chewable tablet Chew 81 mg by mouth daily.     Calcium Carbonate-Vitamin D (CALCIUM-VITAMIN D) 600-125 MG-UNIT TABS Take by mouth daily.     Cholecalciferol (VITAMIN D3) 5000 units TABS Take 1 tablet by mouth daily.     Ibuprofen-Diphenhydramine Cit (IBUPROFEN PM PO) Take by mouth at bedtime as needed.     levothyroxine (SYNTHROID, LEVOTHROID) 125 MCG tablet Take 125 mcg by mouth daily before breakfast.      Multiple Vitamin (MULTIVITAMIN ADULT PO) Take 1 tablet by mouth daily.     simvastatin (ZOCOR) 40 MG tablet Take 1 tablet by mouth at bedtime.     No current facility-administered medications for this visit.    Review of Systems  Constitutional:  Negative for chills, diaphoresis, fever, malaise/fatigue and weight loss.  HENT: Negative.  Negative for congestion, ear discharge, ear pain, hearing loss, nosebleeds, sinus pain, sore throat and tinnitus.   Eyes: Negative.  Negative for blurred vision.  Respiratory: Negative.  Negative for cough, hemoptysis, sputum production and shortness of breath.   Cardiovascular: Negative.  Negative for chest pain, palpitations, orthopnea, leg swelling and PND.  Gastrointestinal: Negative.  Negative for abdominal pain, blood in stool, constipation, diarrhea, heartburn, melena, nausea and vomiting.  Genitourinary: Negative.  Negative for dysuria, frequency, hematuria and urgency.  Musculoskeletal: Negative.  Negative for back pain, joint pain, myalgias and neck pain.  Skin: Negative.  Negative for rash.  Neurological: Negative.  Negative for dizziness, tingling, sensory change, weakness  and headaches.  Endo/Heme/Allergies: Negative.  Does not bruise/bleed easily.       Thyroid disease on Synthroid.  Psychiatric/Behavioral: Negative.  Negative for depression and memory loss. The patient is not nervous/anxious and does not have insomnia.   All  other systems reviewed and are negative.  Performance status (ECOG): 0  Vitals Blood pressure 122/68, pulse 85, temperature (!) 96.1 F (35.6 C), temperature source Tympanic, height 5\' 6"  (1.676 m), weight 138 lb 9.6 oz (62.9 kg), SpO2 100%.   Physical Exam HENT:     Head: Normocephalic.  Eyes:     General: No scleral icterus. Cardiovascular:     Rate and Rhythm: Normal rate.  Pulmonary:     Effort: Pulmonary effort is normal. No respiratory distress.  Abdominal:     General: There is no distension.     Palpations: Abdomen is soft. There is no mass.     Tenderness: There is no abdominal tenderness.  Musculoskeletal:        General: No swelling. Normal range of motion.     Cervical back: Normal range of motion.  Lymphadenopathy:     Cervical: No cervical adenopathy.  Skin:    General: Skin is warm.  Neurological:     General: No focal deficit present.     Mental Status: She is alert and oriented to person, place, and time.  Psychiatric:        Mood and Affect: Mood normal.    Labs are reviewed and discussed with patient. CBC    Component Value Date/Time   WBC 18.1 (H) 08/17/2023 1312   WBC 20.0 (H) 08/18/2022 1252   RBC 4.38 08/17/2023 1312   HGB 14.0 08/17/2023 1312   HCT 42.2 08/17/2023 1312   PLT 229 08/17/2023 1312   MCV 96.3 08/17/2023 1312   MCH 32.0 08/17/2023 1312   MCHC 33.2 08/17/2023 1312   RDW 13.1 08/17/2023 1312   LYMPHSABS 13.1 (H) 08/17/2023 1312   MONOABS 0.7 08/17/2023 1312   EOSABS 0.3 08/17/2023 1312   BASOSABS 0.1 08/17/2023 1312      Latest Ref Rng & Units 08/17/2023    1:13 PM 02/16/2023    1:13 PM 08/18/2022   12:52 PM  CMP  Glucose 70 - 99 mg/dL 161  096  045   BUN 8 - 23 mg/dL 15  14  13    Creatinine 0.44 - 1.00 mg/dL 4.09  8.11  9.14   Sodium 135 - 145 mmol/L 138  139  137   Potassium 3.5 - 5.1 mmol/L 4.6  4.6  4.4   Chloride 98 - 111 mmol/L 103  106  102   CO2 22 - 32 mmol/L 25  26  25    Calcium 8.9 - 10.3 mg/dL 9.4  9.3  9.4    Total Protein 6.5 - 8.1 g/dL 6.8  6.9  7.0   Total Bilirubin 0.0 - 1.2 mg/dL 0.6  0.3  0.5   Alkaline Phos 38 - 126 U/L 42  52  48   AST 15 - 41 U/L 29  31  29    ALT 0 - 44 U/L 20  20  19

## 2024-02-15 ENCOUNTER — Encounter: Payer: Self-pay | Admitting: Oncology

## 2024-02-15 ENCOUNTER — Inpatient Hospital Stay: Payer: Medicare HMO | Admitting: Oncology

## 2024-02-15 ENCOUNTER — Inpatient Hospital Stay: Payer: Medicare HMO | Attending: Oncology

## 2024-02-15 VITALS — BP 128/66 | HR 85 | Temp 97.9°F | Resp 16 | Wt 138.0 lb

## 2024-02-15 DIAGNOSIS — F17210 Nicotine dependence, cigarettes, uncomplicated: Secondary | ICD-10-CM | POA: Diagnosis not present

## 2024-02-15 DIAGNOSIS — Z87891 Personal history of nicotine dependence: Secondary | ICD-10-CM | POA: Diagnosis not present

## 2024-02-15 DIAGNOSIS — C9110 Chronic lymphocytic leukemia of B-cell type not having achieved remission: Secondary | ICD-10-CM

## 2024-02-15 DIAGNOSIS — Z79890 Hormone replacement therapy (postmenopausal): Secondary | ICD-10-CM | POA: Insufficient documentation

## 2024-02-15 DIAGNOSIS — Z7982 Long term (current) use of aspirin: Secondary | ICD-10-CM | POA: Diagnosis not present

## 2024-02-15 DIAGNOSIS — Z79899 Other long term (current) drug therapy: Secondary | ICD-10-CM | POA: Diagnosis not present

## 2024-02-15 LAB — CBC WITH DIFFERENTIAL (CANCER CENTER ONLY)
Abs Immature Granulocytes: 0.03 K/uL (ref 0.00–0.07)
Basophils Absolute: 0.1 K/uL (ref 0.0–0.1)
Basophils Relative: 1 %
Eosinophils Absolute: 0.2 K/uL (ref 0.0–0.5)
Eosinophils Relative: 1 %
HCT: 41.8 % (ref 36.0–46.0)
Hemoglobin: 14 g/dL (ref 12.0–15.0)
Immature Granulocytes: 0 %
Lymphocytes Relative: 73 %
Lymphs Abs: 14.1 K/uL — ABNORMAL HIGH (ref 0.7–4.0)
MCH: 32.1 pg (ref 26.0–34.0)
MCHC: 33.5 g/dL (ref 30.0–36.0)
MCV: 95.9 fL (ref 80.0–100.0)
Monocytes Absolute: 0.7 K/uL (ref 0.1–1.0)
Monocytes Relative: 4 %
Neutro Abs: 3.9 K/uL (ref 1.7–7.7)
Neutrophils Relative %: 21 %
Platelet Count: 210 K/uL (ref 150–400)
RBC: 4.36 MIL/uL (ref 3.87–5.11)
RDW: 12.9 % (ref 11.5–15.5)
Smear Review: NORMAL
WBC Count: 19.1 K/uL — ABNORMAL HIGH (ref 4.0–10.5)
nRBC: 0 % (ref 0.0–0.2)

## 2024-02-15 LAB — CMP (CANCER CENTER ONLY)
ALT: 18 U/L (ref 0–44)
AST: 28 U/L (ref 15–41)
Albumin: 4.5 g/dL (ref 3.5–5.0)
Alkaline Phosphatase: 51 U/L (ref 38–126)
Anion gap: 9 (ref 5–15)
BUN: 13 mg/dL (ref 8–23)
CO2: 24 mmol/L (ref 22–32)
Calcium: 9.5 mg/dL (ref 8.9–10.3)
Chloride: 103 mmol/L (ref 98–111)
Creatinine: 0.78 mg/dL (ref 0.44–1.00)
GFR, Estimated: >60 mL/min (ref >60)
Glucose, Bld: 151 mg/dL — ABNORMAL HIGH (ref 70–99)
Potassium: 4.4 mmol/L (ref 3.5–5.1)
Sodium: 136 mmol/L (ref 135–145)
Total Bilirubin: 0.7 mg/dL (ref 0.0–1.2)
Total Protein: 6.8 g/dL (ref 6.5–8.1)

## 2024-02-15 NOTE — Progress Notes (Signed)
 Hematology/Oncology Progress note [phone]) [phone] Fax:(336) [phone]    Chief Complaint: CLL  ASSESSMENT & PLAN:   Cancer Staging  CLL (chronic lymphocytic leukemia) (HCC) Staging form: Chronic Lymphocytic Leukemia / Small Lymphocytic Lymphoma, AJCC 8th Edition - Clinical stage from 08/18/2022: Modified Rai Stage 0 (Modified Rai risk: Low, Lymphocytosis: Present, Adenopathy: Absent, Organomegaly: Absent, Anemia: Absent, Thrombocytopenia: Absent) - Signed by Babara Call, MD on 08/18/2022   CLL (chronic lymphocytic leukemia) (HCC) CLL, IgVH Somatic Hypermutation detected. Labs reviewed and discussed with patient.  White count has slightly increased. No constitutional symptoms Recommend continue watchful waiting.  Follow-up in 6 months.  Personal history of tobacco use, presenting hazards to health Recommend smoking cessation.   She is up-to-date for lung cancer screening.  Orders Placed This Encounter  Procedures   CMP (Cancer Center only)    Standing Status:   Future    Expected Date:   08/17/2024    Expiration Date:   11/15/2024   CBC with Differential (Cancer Center Only)    Standing Status:   Future    Expected Date:   08/17/2024    Expiration Date:   11/15/2024   Follow-up in 6 months. All questions were answered. The patient knows to call the clinic with any problems, questions or concerns.  Call Babara, MD, PhD [facility] Hematology Oncology 02/15/2024   INTERVAL HISTORY Patient previously followed up by Dr.Corcoran, patient switched care to me on 02/04/21 Patient reports feeling well.  Denies any, night sweats, fever, weight loss.  Current everyday smoker.    Past Medical History:  Diagnosis Date   Thyroid disease     History reviewed. No pertinent surgical history.  Family History  Problem Relation Age of Onset   Breast cancer Neg Hx     Social History:  reports that she has been smoking cigarettes. She has a 64.5 pack-year smoking history. She has  never used smokeless tobacco. No history on file for alcohol use and drug use. She has smoked 1 1/2 packs/day x 40 years.  Allergies: No Known Allergies  Current Medications: Current Outpatient Medications  Medication Sig Dispense Refill   aspirin 81 MG chewable tablet Chew 81 mg by mouth daily.     Calcium Carbonate-Vitamin D (CALCIUM-VITAMIN D) 600-125 MG-UNIT TABS Take by mouth daily.     Cholecalciferol (VITAMIN D3) 5000 units TABS Take 1 tablet by mouth daily.     Ibuprofen-Diphenhydramine Cit (IBUPROFEN PM PO) Take by mouth at bedtime as needed.     levothyroxine (SYNTHROID, LEVOTHROID) 125 MCG tablet Take 125 mcg by mouth daily before breakfast.      Multiple Vitamin (MULTIVITAMIN ADULT PO) Take 1 tablet by mouth daily.     simvastatin (ZOCOR) 40 MG tablet Take 1 tablet by mouth at bedtime.     No current facility-administered medications for this visit.    Review of Systems  Constitutional:  Negative for chills, diaphoresis, fever, malaise/fatigue and weight loss.  HENT: Negative.  Negative for congestion, ear discharge, ear pain, hearing loss, nosebleeds, sinus pain, sore throat and tinnitus.   Eyes: Negative.  Negative for blurred vision.  Respiratory: Negative.  Negative for cough, hemoptysis, sputum production and shortness of breath.   Cardiovascular: Negative.  Negative for chest pain, palpitations, orthopnea, leg swelling and PND.  Gastrointestinal: Negative.  Negative for abdominal pain, blood in stool, constipation, diarrhea, heartburn, melena, nausea and vomiting.  Genitourinary: Negative.  Negative for dysuria, frequency, hematuria and urgency.  Musculoskeletal: Negative.  Negative for back pain, joint  pain, myalgias and neck pain.  Skin: Negative.  Negative for rash.  Neurological: Negative.  Negative for dizziness, tingling, sensory change, weakness and headaches.  Endo/Heme/Allergies: Negative.  Does not bruise/bleed easily.       Thyroid disease on Synthroid.   Psychiatric/Behavioral: Negative.  Negative for depression and memory loss. The patient is not nervous/anxious and does not have insomnia.   All other systems reviewed and are negative.  Performance status (ECOG): 0  Vitals Blood pressure 128/66, pulse 85, temperature 97.9 F (36.6 C), temperature source Tympanic, resp. rate 16, weight 138 lb (62.6 kg), SpO2 100%.   Physical Exam HENT:     Head: Normocephalic.  Eyes:     General: No scleral icterus. Cardiovascular:     Rate and Rhythm: Normal rate.  Pulmonary:     Effort: Pulmonary effort is normal. No respiratory distress.  Abdominal:     General: There is no distension.     Palpations: Abdomen is soft. There is no mass.     Tenderness: There is no abdominal tenderness.  Musculoskeletal:        General: No swelling. Normal range of motion.     Cervical back: Normal range of motion.  Lymphadenopathy:     Cervical: No cervical adenopathy.  Skin:    General: Skin is warm.  Neurological:     General: No focal deficit present.     Mental Status: She is alert and oriented to person, place, and time.  Psychiatric:        Mood and Affect: Mood normal.    Labs are reviewed and discussed with patient. CBC    Component Value Date/Time   WBC 19.1 (H) 02/15/2024 1257   WBC 20.0 (H) 08/18/2022 1252   RBC 4.36 02/15/2024 1257   HGB 14.0 02/15/2024 1257   HCT 41.8 02/15/2024 1257   PLT 210 02/15/2024 1257   MCV 95.9 02/15/2024 1257   MCH 32.1 02/15/2024 1257   MCHC 33.5 02/15/2024 1257   RDW 12.9 02/15/2024 1257   LYMPHSABS 14.1 (H) 02/15/2024 1257   MONOABS 0.7 02/15/2024 1257   EOSABS 0.2 02/15/2024 1257   BASOSABS 0.1 02/15/2024 1257      Latest Ref Rng & Units 02/15/2024   12:57 PM 08/17/2023    1:13 PM 02/16/2023    1:13 PM  CMP  Glucose 70 - 99 mg/dL 848  855  890   BUN 8 - 23 mg/dL 13  15  14    Creatinine 0.44 - 1.00 mg/dL 9.21  9.13  9.28   Sodium 135 - 145 mmol/L 136  138  139   Potassium 3.5 - 5.1 mmol/L 4.4   4.6  4.6   Chloride 98 - 111 mmol/L 103  103  106   CO2 22 - 32 mmol/L 24  25  26    Calcium 8.9 - 10.3 mg/dL 9.5  9.4  9.3   Total Protein 6.5 - 8.1 g/dL 6.8  6.8  6.9   Total Bilirubin 0.0 - 1.2 mg/dL 0.7  0.6  0.3   Alkaline Phos 38 - 126 U/L 51  42  52   AST 15 - 41 U/L 28  29  31    ALT 0 - 44 U/L 18  20  20

## 2024-02-15 NOTE — Assessment & Plan Note (Signed)
 Recommend smoking cessation.   She is up-to-date for lung cancer screening.

## 2024-02-15 NOTE — Assessment & Plan Note (Addendum)
 CLL, IgVH Somatic Hypermutation detected. Labs reviewed and discussed with patient.  White count has slightly increased. No constitutional symptoms Recommend continue watchful waiting.  Follow-up in 6 months.

## 2024-05-22 ENCOUNTER — Other Ambulatory Visit: Payer: Self-pay | Admitting: Internal Medicine

## 2024-05-22 DIAGNOSIS — Z1231 Encounter for screening mammogram for malignant neoplasm of breast: Secondary | ICD-10-CM

## 2024-06-19 ENCOUNTER — Ambulatory Visit: Admission: RE | Admit: 2024-06-19 | Discharge: 2024-06-19 | Attending: Acute Care | Admitting: Acute Care

## 2024-06-19 DIAGNOSIS — Z122 Encounter for screening for malignant neoplasm of respiratory organs: Secondary | ICD-10-CM | POA: Diagnosis present

## 2024-06-19 DIAGNOSIS — F17210 Nicotine dependence, cigarettes, uncomplicated: Secondary | ICD-10-CM | POA: Insufficient documentation

## 2024-06-19 DIAGNOSIS — Z87891 Personal history of nicotine dependence: Secondary | ICD-10-CM | POA: Diagnosis present

## 2024-07-01 ENCOUNTER — Other Ambulatory Visit: Payer: Self-pay | Admitting: Acute Care

## 2024-07-01 DIAGNOSIS — Z122 Encounter for screening for malignant neoplasm of respiratory organs: Secondary | ICD-10-CM

## 2024-07-01 DIAGNOSIS — Z87891 Personal history of nicotine dependence: Secondary | ICD-10-CM

## 2024-07-01 DIAGNOSIS — F17210 Nicotine dependence, cigarettes, uncomplicated: Secondary | ICD-10-CM

## 2024-07-08 ENCOUNTER — Ambulatory Visit
Admission: RE | Admit: 2024-07-08 | Discharge: 2024-07-08 | Disposition: A | Discharge status: Home / Self Care | Source: Ambulatory Visit | Attending: Internal Medicine | Admitting: Internal Medicine

## 2024-07-08 DIAGNOSIS — Z1231 Encounter for screening mammogram for malignant neoplasm of breast: Secondary | ICD-10-CM | POA: Insufficient documentation

## 2024-08-29 ENCOUNTER — Other Ambulatory Visit

## 2024-08-29 ENCOUNTER — Ambulatory Visit: Admitting: Oncology
